# Patient Record
Sex: Female | Born: 1955 | Race: White | Hispanic: No | Marital: Single | Smoking: Never smoker
Health system: Southern US, Community
[De-identification: ages and names within clinical notes are randomized; demographics above are authoritative.]

## PROBLEM LIST (undated history)

## (undated) DIAGNOSIS — T7840XA Allergy, unspecified, initial encounter: Secondary | ICD-10-CM

## (undated) HISTORY — PX: COLONOSCOPY WITH PROPOFOL: SHX5780

## (undated) HISTORY — PX: UTERINE ARTERY EMBOLIZATION: SHX2629

## (undated) HISTORY — DX: Allergy, unspecified, initial encounter: T78.40XA

---

## 2014-06-12 ENCOUNTER — Other Ambulatory Visit (HOSPITAL_COMMUNITY)
Admission: RE | Admit: 2014-06-12 | Discharge: 2014-06-12 | Disposition: A | Discharge status: Home / Self Care | Payer: 59 | Source: Ambulatory Visit | Attending: Family Medicine | Admitting: Family Medicine

## 2014-06-12 DIAGNOSIS — Z124 Encounter for screening for malignant neoplasm of cervix: Secondary | ICD-10-CM | POA: Insufficient documentation

## 2014-06-12 DIAGNOSIS — Z1151 Encounter for screening for human papillomavirus (HPV): Secondary | ICD-10-CM | POA: Insufficient documentation

## 2014-06-18 ENCOUNTER — Other Ambulatory Visit: Payer: Self-pay

## 2014-06-18 ENCOUNTER — Ambulatory Visit
Admission: RE | Admit: 2014-06-18 | Discharge: 2014-06-18 | Disposition: A | Discharge status: Home / Self Care | Payer: 59 | Source: Ambulatory Visit

## 2014-06-18 DIAGNOSIS — Z1231 Encounter for screening mammogram for malignant neoplasm of breast: Secondary | ICD-10-CM

## 2015-06-18 DIAGNOSIS — E782 Mixed hyperlipidemia: Secondary | ICD-10-CM | POA: Diagnosis not present

## 2015-06-18 DIAGNOSIS — Z0000 Encounter for general adult medical examination without abnormal findings: Secondary | ICD-10-CM | POA: Diagnosis not present

## 2015-12-02 DIAGNOSIS — Z23 Encounter for immunization: Secondary | ICD-10-CM | POA: Diagnosis not present

## 2016-07-02 ENCOUNTER — Other Ambulatory Visit: Payer: Self-pay | Admitting: Family Medicine

## 2016-07-02 DIAGNOSIS — I10 Essential (primary) hypertension: Secondary | ICD-10-CM | POA: Diagnosis not present

## 2016-07-02 DIAGNOSIS — E782 Mixed hyperlipidemia: Secondary | ICD-10-CM | POA: Diagnosis not present

## 2016-07-02 DIAGNOSIS — Z0000 Encounter for general adult medical examination without abnormal findings: Secondary | ICD-10-CM | POA: Diagnosis not present

## 2016-07-02 DIAGNOSIS — D259 Leiomyoma of uterus, unspecified: Secondary | ICD-10-CM

## 2016-07-08 ENCOUNTER — Other Ambulatory Visit: Payer: Self-pay | Admitting: Family Medicine

## 2016-07-08 DIAGNOSIS — Z1231 Encounter for screening mammogram for malignant neoplasm of breast: Secondary | ICD-10-CM

## 2016-07-15 ENCOUNTER — Ambulatory Visit
Admission: RE | Admit: 2016-07-15 | Discharge: 2016-07-15 | Disposition: A | Discharge status: Home / Self Care | Payer: BLUE CROSS/BLUE SHIELD | Source: Ambulatory Visit | Attending: Family Medicine | Admitting: Family Medicine

## 2016-07-15 DIAGNOSIS — D259 Leiomyoma of uterus, unspecified: Secondary | ICD-10-CM

## 2016-07-15 DIAGNOSIS — D251 Intramural leiomyoma of uterus: Secondary | ICD-10-CM | POA: Diagnosis not present

## 2016-07-21 ENCOUNTER — Ambulatory Visit
Admission: RE | Admit: 2016-07-21 | Discharge: 2016-07-21 | Disposition: A | Discharge status: Home / Self Care | Payer: BLUE CROSS/BLUE SHIELD | Source: Ambulatory Visit | Attending: Family Medicine | Admitting: Family Medicine

## 2016-07-21 DIAGNOSIS — Z23 Encounter for immunization: Secondary | ICD-10-CM | POA: Diagnosis not present

## 2016-07-21 DIAGNOSIS — Z1231 Encounter for screening mammogram for malignant neoplasm of breast: Secondary | ICD-10-CM

## 2016-09-30 DIAGNOSIS — Z23 Encounter for immunization: Secondary | ICD-10-CM | POA: Diagnosis not present

## 2016-10-05 DIAGNOSIS — J0100 Acute maxillary sinusitis, unspecified: Secondary | ICD-10-CM | POA: Diagnosis not present

## 2017-10-03 DIAGNOSIS — C44319 Basal cell carcinoma of skin of other parts of face: Secondary | ICD-10-CM | POA: Diagnosis not present

## 2017-10-31 DIAGNOSIS — Z08 Encounter for follow-up examination after completed treatment for malignant neoplasm: Secondary | ICD-10-CM | POA: Diagnosis not present

## 2017-10-31 DIAGNOSIS — Z85828 Personal history of other malignant neoplasm of skin: Secondary | ICD-10-CM | POA: Diagnosis not present

## 2017-12-23 ENCOUNTER — Other Ambulatory Visit (HOSPITAL_COMMUNITY)
Admission: RE | Admit: 2017-12-23 | Discharge: 2017-12-23 | Disposition: A | Discharge status: Home / Self Care | Payer: BLUE CROSS/BLUE SHIELD | Source: Ambulatory Visit | Attending: Family Medicine | Admitting: Family Medicine

## 2017-12-23 ENCOUNTER — Other Ambulatory Visit: Payer: Self-pay | Admitting: Family Medicine

## 2017-12-23 DIAGNOSIS — I10 Essential (primary) hypertension: Secondary | ICD-10-CM | POA: Diagnosis not present

## 2017-12-23 DIAGNOSIS — Z01411 Encounter for gynecological examination (general) (routine) with abnormal findings: Secondary | ICD-10-CM | POA: Diagnosis not present

## 2017-12-23 DIAGNOSIS — E782 Mixed hyperlipidemia: Secondary | ICD-10-CM | POA: Diagnosis not present

## 2017-12-23 DIAGNOSIS — Z0000 Encounter for general adult medical examination without abnormal findings: Secondary | ICD-10-CM | POA: Diagnosis not present

## 2017-12-23 DIAGNOSIS — Z23 Encounter for immunization: Secondary | ICD-10-CM | POA: Diagnosis not present

## 2017-12-26 ENCOUNTER — Other Ambulatory Visit: Payer: Self-pay | Admitting: Family Medicine

## 2017-12-26 DIAGNOSIS — R102 Pelvic and perineal pain: Secondary | ICD-10-CM

## 2017-12-27 LAB — CYTOLOGY - PAP
Adequacy: ABSENT
DIAGNOSIS: NEGATIVE
HPV (WINDOPATH): NOT DETECTED

## 2018-01-09 ENCOUNTER — Other Ambulatory Visit: Payer: Self-pay | Admitting: Family Medicine

## 2018-01-09 DIAGNOSIS — Z1231 Encounter for screening mammogram for malignant neoplasm of breast: Secondary | ICD-10-CM

## 2018-01-12 ENCOUNTER — Ambulatory Visit
Admission: RE | Admit: 2018-01-12 | Discharge: 2018-01-12 | Disposition: A | Discharge status: Home / Self Care | Payer: BLUE CROSS/BLUE SHIELD | Source: Ambulatory Visit | Attending: Family Medicine | Admitting: Family Medicine

## 2018-01-12 DIAGNOSIS — D259 Leiomyoma of uterus, unspecified: Secondary | ICD-10-CM | POA: Diagnosis not present

## 2018-01-12 DIAGNOSIS — R102 Pelvic and perineal pain: Secondary | ICD-10-CM

## 2018-01-17 ENCOUNTER — Ambulatory Visit
Admission: RE | Admit: 2018-01-17 | Discharge: 2018-01-17 | Disposition: A | Discharge status: Home / Self Care | Payer: BLUE CROSS/BLUE SHIELD | Source: Ambulatory Visit | Attending: Family Medicine | Admitting: Family Medicine

## 2018-01-17 DIAGNOSIS — Z1231 Encounter for screening mammogram for malignant neoplasm of breast: Secondary | ICD-10-CM

## 2018-11-19 IMAGING — US US PELVIS COMPLETE TRANSABD/TRANSVAG
1 series · 14 of 25 positions shown · non-contrast
Comparison: 07/15/2016 ultrasound

CLINICAL DATA: 62-year-old postmenopausal female with pelvic pain.
Family history of ovarian cancer.

EXAM:
TRANSABDOMINAL AND TRANSVAGINAL ULTRASOUND OF PELVIS
TECHNIQUE: Both transabdominal and transvaginal ultrasound examinations of the
pelvis were performed. Transabdominal technique was performed for
global imaging of the pelvis including uterus, ovaries, adnexal
regions, and pelvic cul-de-sac. It was necessary to proceed with
endovaginal exam following the transabdominal exam to visualize the
ovaries and endometrium.

[Series 1: us pelvis complete transabd/transvag · 0.30mm/px · 14 of 33 slices shown]
[im 1/33]
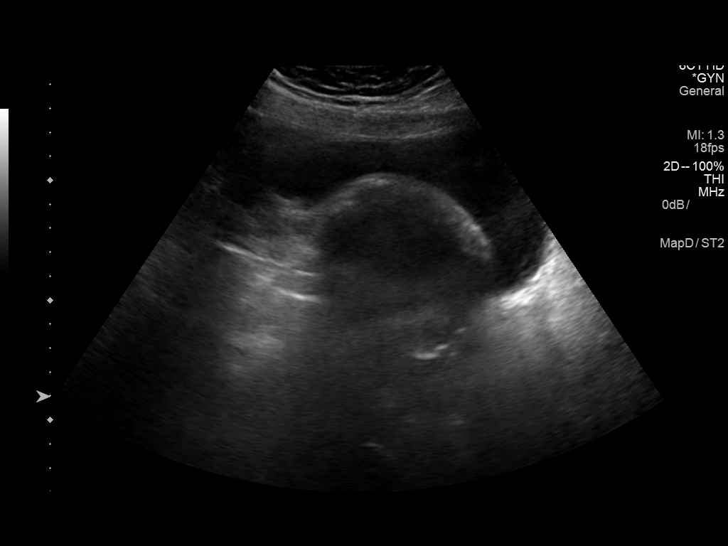
[im 3/33]
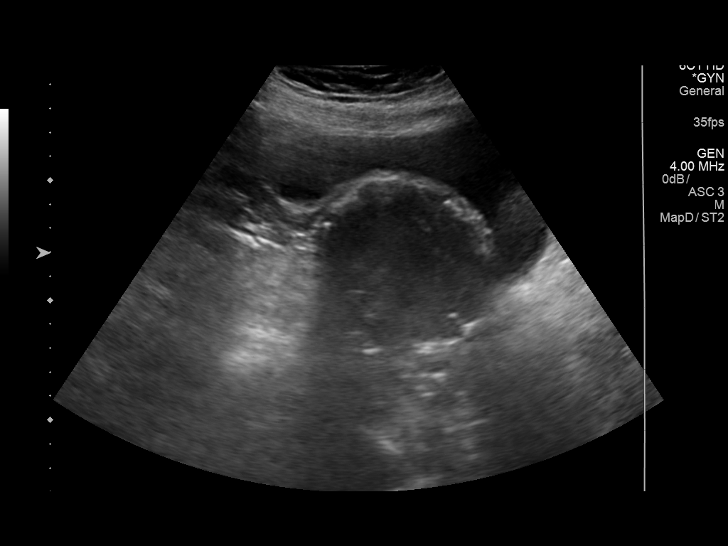
[im 6/33]
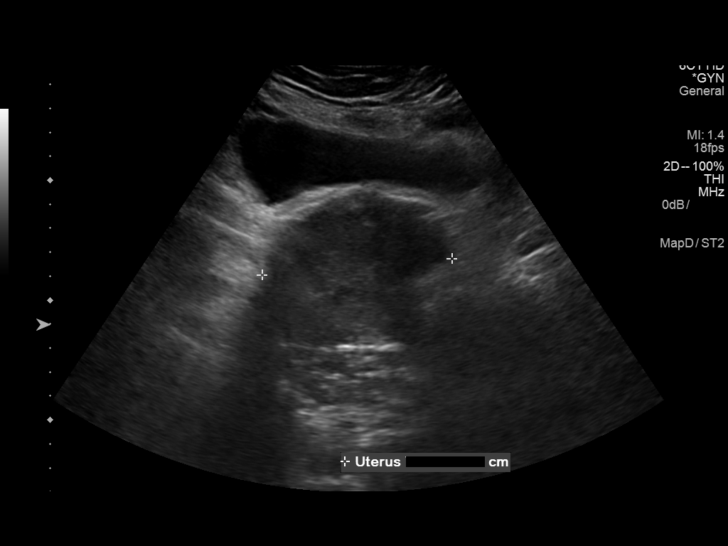
[im 9/33]
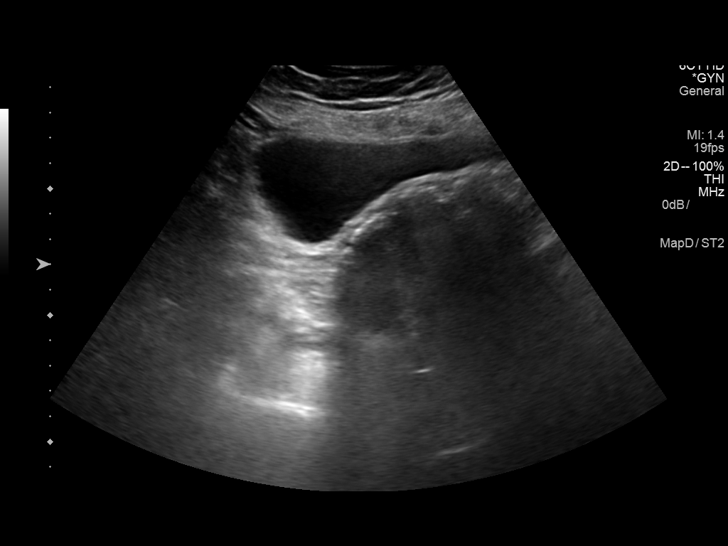
[im 11/33]
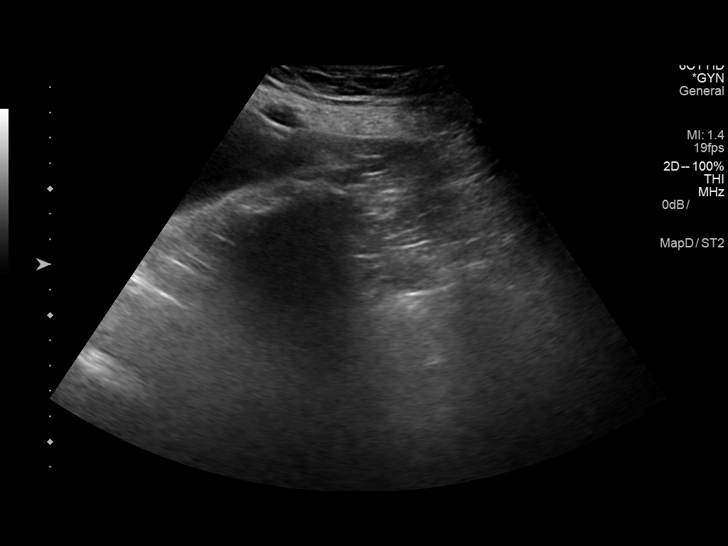
[im 13/33]
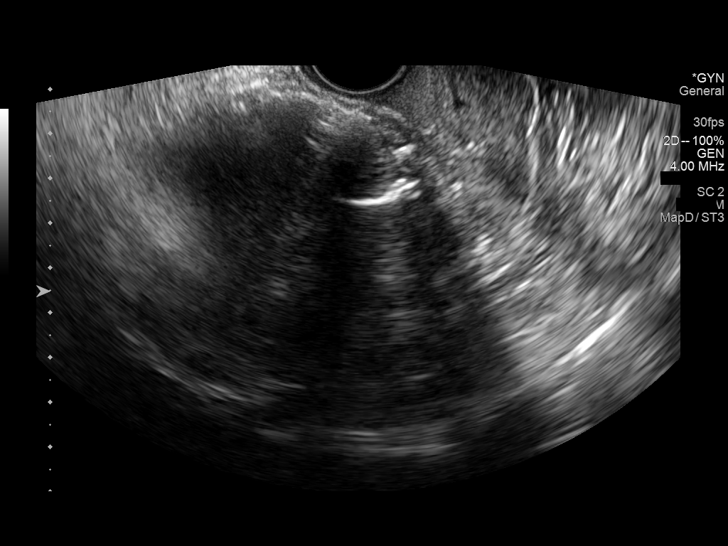
[im 15/33]
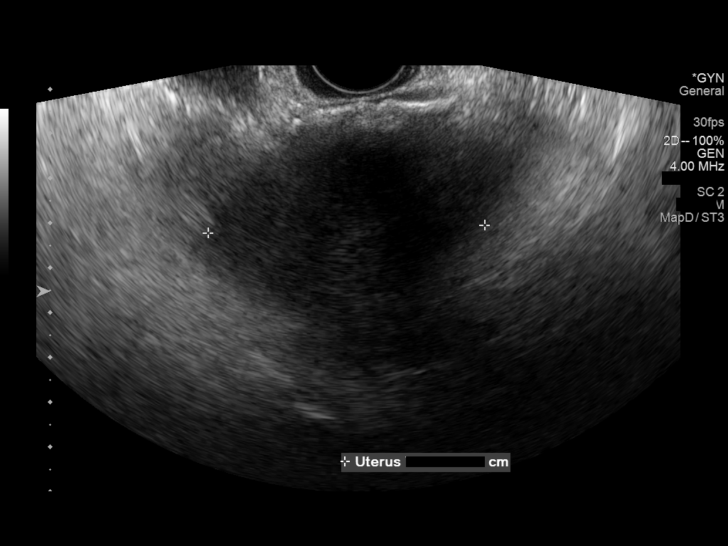
[im 18/33]
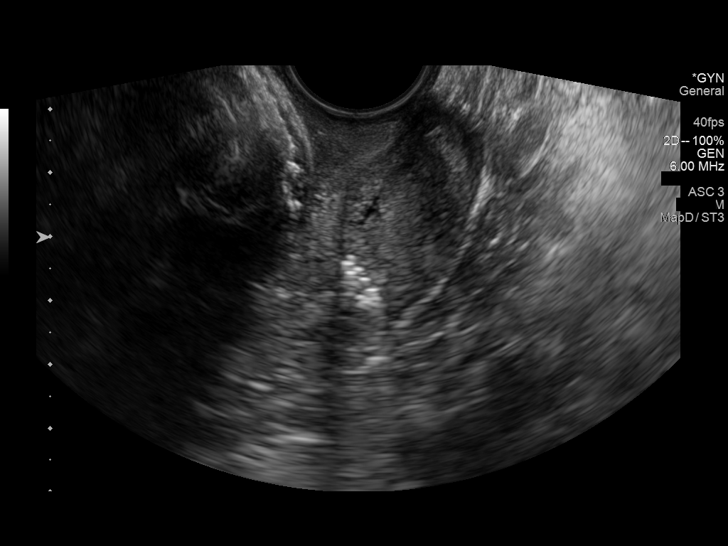
[im 21/33]
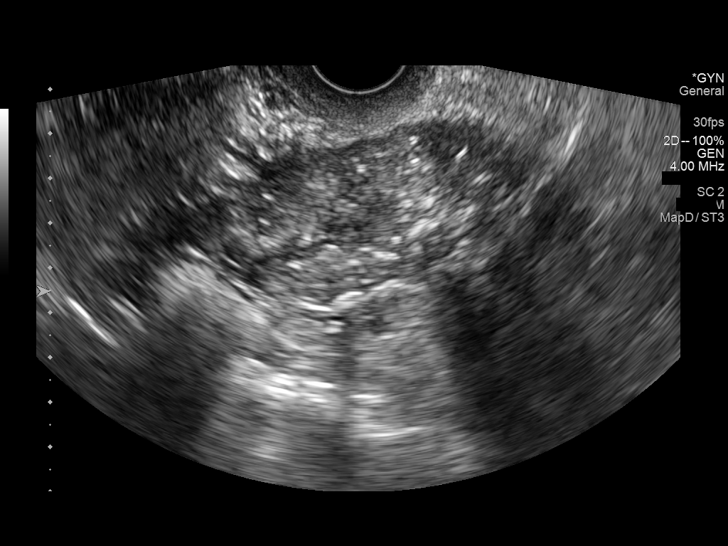
[im 22/33]
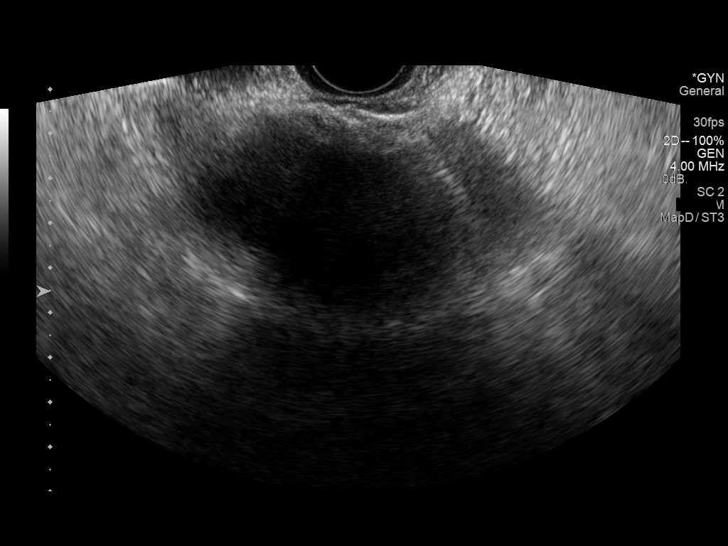
[im 25/33]
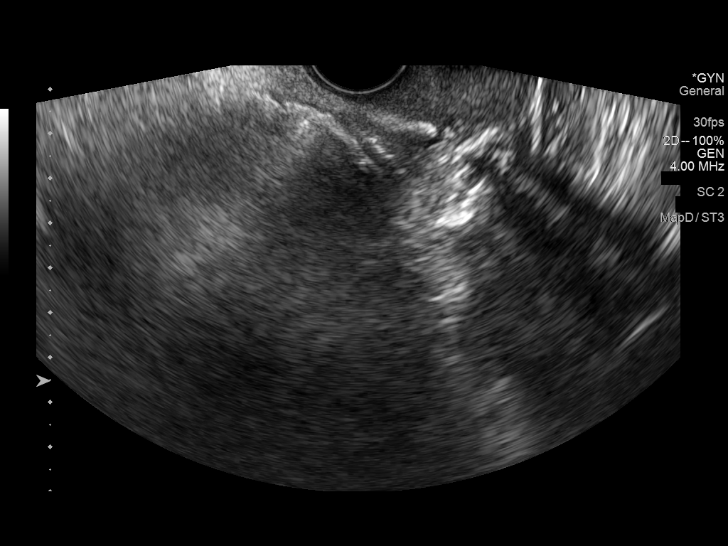
[im 27/33]
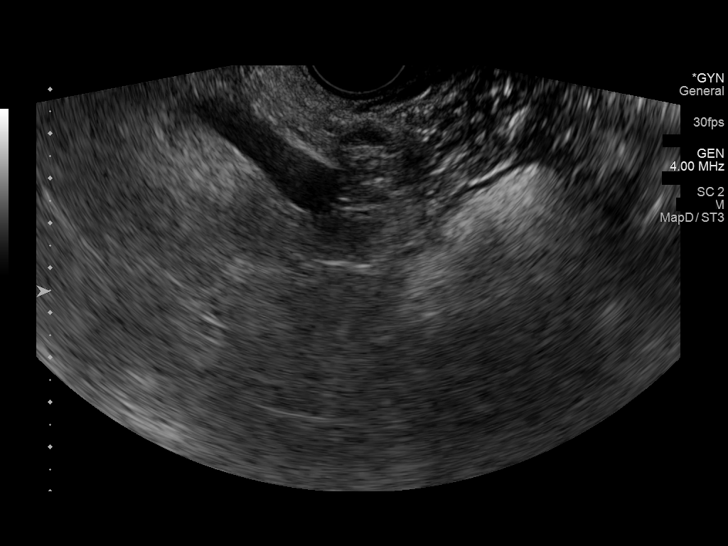
[im 30/33]
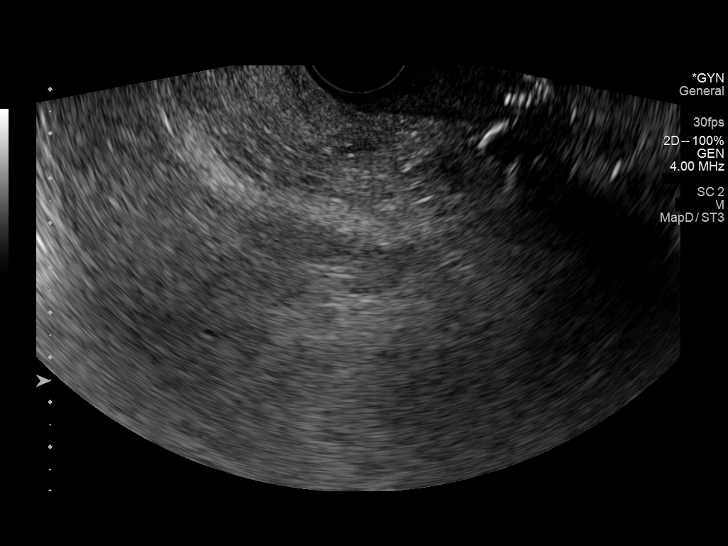
[im 33/33]
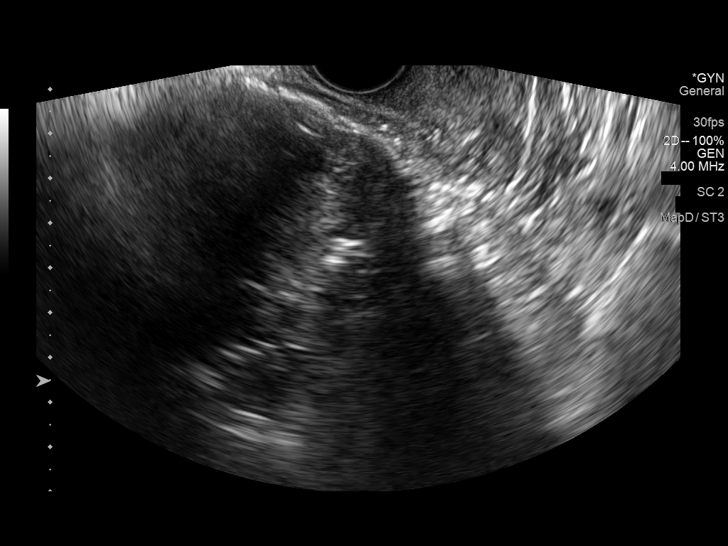

[14 of 25 positions shown; findings below may reference images not displayed]

FINDINGS: Uterus

Measurements: 9.2 x 6.6 x 6.2 cm.. Heterogeneous echotexture with
large calcified fibroid again noted measuring at least 6 cm.

Endometrium

Thickness: Not visualized due to fibroid and uterine heterogeneity.

Right ovary

Not visualized

Left ovary

Not visualized

Other findings

No abnormal free fluid or adnexal mass.
IMPRESSION: 1. Ovaries bilaterally not visualized.  No adnexal mass identified.
2. Large calcified fibroid again noted. The endometrial stripe and
remainder of the uterus is not well evaluated due to this fibroid.

## 2021-12-22 ENCOUNTER — Other Ambulatory Visit: Payer: Self-pay

## 2021-12-22 ENCOUNTER — Ambulatory Visit (INDEPENDENT_AMBULATORY_CARE_PROVIDER_SITE_OTHER): Payer: Medicare Other | Admitting: Family

## 2021-12-22 ENCOUNTER — Encounter: Payer: Self-pay | Admitting: Family

## 2021-12-22 VITALS — BP 132/89 | HR 72 | Temp 98.6°F | Resp 16 | Ht 67.5 in | Wt 216.4 lb

## 2021-12-22 DIAGNOSIS — Z23 Encounter for immunization: Secondary | ICD-10-CM

## 2021-12-22 DIAGNOSIS — E559 Vitamin D deficiency, unspecified: Secondary | ICD-10-CM | POA: Diagnosis not present

## 2021-12-22 DIAGNOSIS — Z1231 Encounter for screening mammogram for malignant neoplasm of breast: Secondary | ICD-10-CM

## 2021-12-22 DIAGNOSIS — D251 Intramural leiomyoma of uterus: Secondary | ICD-10-CM | POA: Insufficient documentation

## 2021-12-22 DIAGNOSIS — F418 Other specified anxiety disorders: Secondary | ICD-10-CM

## 2021-12-22 DIAGNOSIS — L659 Nonscarring hair loss, unspecified: Secondary | ICD-10-CM | POA: Diagnosis not present

## 2021-12-22 DIAGNOSIS — Z13220 Encounter for screening for lipoid disorders: Secondary | ICD-10-CM | POA: Diagnosis not present

## 2021-12-22 DIAGNOSIS — J301 Allergic rhinitis due to pollen: Secondary | ICD-10-CM | POA: Diagnosis not present

## 2021-12-22 DIAGNOSIS — Z0000 Encounter for general adult medical examination without abnormal findings: Secondary | ICD-10-CM

## 2021-12-22 DIAGNOSIS — Z5320 Procedure and treatment not carried out because of patient's decision for unspecified reasons: Secondary | ICD-10-CM

## 2021-12-22 DIAGNOSIS — Z1283 Encounter for screening for malignant neoplasm of skin: Secondary | ICD-10-CM

## 2021-12-22 DIAGNOSIS — D509 Iron deficiency anemia, unspecified: Secondary | ICD-10-CM

## 2021-12-22 DIAGNOSIS — Z780 Asymptomatic menopausal state: Secondary | ICD-10-CM

## 2021-12-22 DIAGNOSIS — Z1211 Encounter for screening for malignant neoplasm of colon: Secondary | ICD-10-CM

## 2021-12-22 DIAGNOSIS — D229 Melanocytic nevi, unspecified: Secondary | ICD-10-CM

## 2021-12-22 MED ORDER — ALPRAZOLAM 0.5 MG PO TABS: 0.5000 mg | ORAL_TABLET | Freq: Every evening | ORAL | 0 refills | Status: AC | PRN

## 2021-12-22 MED ORDER — ALPRAZOLAM 0.5 MG PO TABS: 0.5000 mg | ORAL_TABLET | Freq: Every evening | ORAL | 0 refills | Status: DC | PRN

## 2021-12-22 NOTE — Patient Instructions (Addendum)
------------------------------------   I have ordered transvaginal ultrasound at [place]. Please call to schedule.   Teller: [facility]- enter through medical mall entrance [address], [place], [place] [address]   ------------------------------------ I have sent an electronic order over to your preferred location for the following:   '[]'$   2D Mammogram  '[x]'$   3D Mammogram  '[x]'$   Bone Density   Please give this center a call to get scheduled at your convenience.  '[x]'$   [facility]  [place] [address]  [phone]  Make sure to wear two piece  clothing  No lotions powders or deodorants the day of the appointment Make sure to bring picture ID and insurance card.  Bring list of medications you are currently taking including any supplements.   ------------------------------------ Start monitoring your blood pressure daily, around the same time of day, for the next 2-3 weeks.  Ensure that you have rested for 30 minutes prior to checking your blood pressure. Record your readings and bring them to your next visit.   ------------------------------------ A referral was placed today for dermatology Please let us know if you have not heard back within 2 weeks about the referral.  ------------------------------------  A referral was placed today for colonoscopy.  Please let us know if you have not heard back within 2 weeks about the referral.  ------------------------------------  Welcome to our clinic, I am happy to have you as my new patient. I am excited to continue on this healthcare journey with you.  Stop by the lab prior to leaving today. I will notify you of your results once received.   Please keep in mind Any my chart messages you send have up to a three business day turnaround for a response.  Phone calls may take up to a one full business day turnaround for a   response.   If you need a medication refill I recommend you request it through the pharmacy as this is easiest for [place] rather than sending a message and or phone call.   Due to recent changes in healthcare laws, you may see results of your imaging and/or laboratory studies on MyChart before I have had a chance to review them.  I understand that in some cases there may be results that are confusing or concerning to you. Please understand that not all results are received at the same time and often I may need to interpret multiple results in order to provide you with the best plan of care or course of treatment. Therefore, I ask that you please give me 2 business days to thoroughly review all your results before contacting my office for clarification. Should we see a critical lab result, you will be contacted sooner.   It was a pleasure seeing you today! Please do not hesitate to reach out with any questions and or concerns.  Regards,   Eugenia Pancoast FNP-C

## 2021-12-22 NOTE — Assessment & Plan Note (Addendum)
Propanolol 10 mg for situational anxiety also refilled alprazolam 0.5 mg prn anxiety  PDMP reviewed prior  Along with annual physical discussed history/social/family history as well as a few acute and chronic concerns in office.

## 2021-12-22 NOTE — Assessment & Plan Note (Signed)
Cbc ordered pending results. 

## 2021-12-22 NOTE — Assessment & Plan Note (Signed)
Patient Counseling(The following topics were reviewed):  Preventative care handout given to pt  Health maintenance and immunizations reviewed. Please refer to Health maintenance section. Pt advised on safe sex, wearing seatbelts in car, and proper nutrition labwork ordered today for annual Dental health: Discussed importance of regular tooth brushing, flossing, and dental visits.  Ordered mammogram  Ordered bone density Referral placed for colonoscopy Pt declines hepatitis c and hiv screening Pneumonia 23 today in office, consider prevnar next year Pt to schedule pap next available

## 2021-12-22 NOTE — Progress Notes (Signed)
New Patient Office Visit  Subjective:  Patient ID: Amanda Owens, female    DOB: Apr 01, 1955  Age: 66 y.o. MRN: [id number]  CC:  Chief Complaint  Patient presents with  . Establish Care    HPI Amanda Owens is here to establish care as a new patient.  Prior provider was: [facility] , physician left practice , last there around 2019.  Pt is without acute concerns.   Last pap 12/23/2017, negative.  Mammogram: last 2019 as well  Colonoscopy: last one she states over 10 years ago.  Father with colon cancer history and dads mom with h/o colon cancer.   Pneumo 23, never had.   Thinning hair with hair loss, all over her body. She was seen by dermatology in the past, about ten years ago, tried propecia without much relief.   chronic concerns:  Pap: she states historically always got an ultrasound of her pelvis when she got a pap due to uterine fibroid. She is requesting this as well. She is concerned because both of her sister have had ovarian cancer, one passed away.   GAD: takes xanax 0.5 mg once daily prn, usually with social anxiety or if she has public speaking. Driving on the highway can also stress her out, so typically will just take the propanolol.   Tachycardia: usually with social anxiety, will typically take one each of 10 mg propanolol as well as one xanax as needed.    Elevated blood pressure: does have fmh hypertension. Her sister who she lives with at home does have blood pressure machine.   Past Medical History:  Diagnosis Date  . Allergy     Past Surgical History:  Procedure Laterality Date  . UTERINE ARTERY EMBOLIZATION      Family History  Problem Relation Age of Onset  . Stroke Mother   . Hypertension Mother   . Colon cancer Father        dx around age 96  . Ovarian cancer Sister   . Hypertension Sister   . Ovarian cancer Sister   . Hypertension Sister   . Colon polyps Sister        precancerous  . Hypertension Sister   . Diabetes  Sister   . Early death Maternal Grandmother   . Stroke Maternal Grandfather   . Early death Maternal Grandfather   . Colon cancer Paternal Grandmother   . Early death Paternal Grandfather   . Breast cancer Neg Hx     Social History   Socioeconomic History  . Marital status: Single    Spouse name: Not on file  . Number of children: 0  . Years of education: Not on file  . Highest education level: Not on file  Occupational History  . Occupation: retired  Tobacco Use  . Smoking status: Never  . Smokeless tobacco: Never  Vaping Use  . Vaping Use: Never used  Substance and Sexual Activity  . Alcohol use: Yes    Alcohol/week: 1.0 standard drink of alcohol    Types: 1 Shots of liquor per week    Comment: one shot bourbon with dinner  . Drug use: Never  . Sexual activity: Not Currently  Other Topics Concern  . Not on file  Social History Narrative  . Not on file   Social Determinants of Health   Financial Resource Strain: Not on file  Food Insecurity: Not on file  Transportation Needs: Not on file  Physical Activity: Not on file  Stress: Not on  file  Social Connections: Not on file  Intimate Partner Violence: Not on file    Outpatient Medications Prior to Visit  Medication Sig Dispense Refill  . cetirizine (ZYRTEC ALLERGY) 10 MG tablet     . propranolol (INDERAL) 10 MG tablet     . ALPRAZolam (XANAX) 0.5 MG tablet      No facility-administered medications prior to visit.    Allergies  Allergen Reactions  . Sulfa Antibiotics Hives and Swelling    ROS Review of Systems  Review of Systems  Respiratory:  Negative for shortness of breath.   Cardiovascular:  Negative for chest pain and palpitations.  Gastrointestinal:  Negative for constipation and diarrhea.  Genitourinary:  Negative for dysuria, frequency and urgency.  Musculoskeletal:  Negative for myalgias.  Psychiatric/Behavioral:  Negative for depression and suicidal ideas. Anxiety at times  Skin: thinning  hair line, chronic  All other systems reviewed and are negative.    Objective:    Physical Exam Vitals reviewed.  Constitutional:      General: She is not in acute distress.    Appearance: Normal appearance. She is obese. She is not ill-appearing, toxic-appearing or diaphoretic.  HENT:     Right Ear: Tympanic membrane normal.     Left Ear: Tympanic membrane normal.     Mouth/Throat:     Mouth: Mucous membranes are moist.     Pharynx: No pharyngeal swelling.     Tonsils: No tonsillar exudate.  Eyes:     Extraocular Movements: Extraocular movements intact.     Conjunctiva/sclera: Conjunctivae normal.     Pupils: Pupils are equal, round, and reactive to light.  Neck:     Thyroid: No thyroid mass.  Cardiovascular:     Rate and Rhythm: Normal rate and regular rhythm.  Pulmonary:     Effort: Pulmonary effort is normal.     Breath sounds: Normal breath sounds.  Abdominal:     General: Abdomen is flat. Bowel sounds are normal.     Palpations: Abdomen is soft.  Musculoskeletal:        General: Normal range of motion.  Lymphadenopathy:     Cervical:     Right cervical: No superficial cervical adenopathy.    Left cervical: No superficial cervical adenopathy.  Skin:    General: Skin is warm.     Capillary Refill: Capillary refill takes less than 2 seconds.     Comments: Receding of hairline anterior scalp   Neurological:     General: No focal deficit present.     Mental Status: She is alert and oriented to person, place, and time.  Psychiatric:        Mood and Affect: Mood normal.        Behavior: Behavior normal.        Thought Content: Thought content normal.        Judgment: Judgment normal.      BP 132/89   Pulse 72   Temp 98.6 F (37 C)   Resp 16   Ht 5' 7.5" (1.715 m)   Wt 216 lb 6 oz (98.1 kg)   SpO2 97%   BMI 33.39 kg/m  Wt Readings from Last 3 Encounters:  12/22/21 216 lb 6 oz (98.1 kg)     Health Maintenance Due  Topic Date Due  . COLONOSCOPY (Pts  45-78yr Insurance coverage will need to be confirmed)  Never done  . MAMMOGRAM  01/18/2020  . PAP SMEAR-Modifier  12/23/2020  . DEXA SCAN  Never done  There are no preventive care reminders to display for this patient.  No results found for: "TSH" No results found for: "WBC", "HGB", "HCT", "MCV", "PLT" No results found for: "NA", "K", "CHLORIDE", "CO2", "GLUCOSE", "BUN", "CREATININE", "BILITOT", "ALKPHOS", "AST", "ALT", "PROT", "ALBUMIN", "CALCIUM", "ANIONGAP", "EGFR", "GFR" No results found for: "CHOL" No results found for: "HDL" No results found for: "LDLCALC" No results found for: "TRIG" No results found for: "CHOLHDL" No results found for: "HGBA1C"    Assessment & Plan:   Problem List Items Addressed This Visit       Respiratory   Seasonal allergic rhinitis due to pollen    Recommend nightly otc zyrtec         Musculoskeletal and Integument   Alopecia    Ordering CBC and TSH Referral back to dermatology for this as well as annual skin screening and eval of moles      Relevant Orders   Ambulatory referral to Dermatology     Genitourinary   Intramural leiomyoma of uterus    Transvaginal ultrasound ordered       Relevant Orders   US PELVIC COMPLETE WITH TRANSVAGINAL     Other   Situational anxiety    Propanolol 10 mg for situational anxiety also refilled alprazolam 0.5 mg prn anxiety  PDMP reviewed prior  Along with annual physical discussed history/social/family history as well as a few acute and chronic concerns in office.       Relevant Medications   ALPRAZolam (XANAX) 0.5 MG tablet   Iron deficiency anemia    Cbc ordered pending results      Relevant Orders   CBC with Differential/Platelet   Vitamin D deficiency    Ordered vitamin d pending results.        Relevant Orders   Vitamin D, 25-hydroxy   Encounter for general adult medical examination without abnormal findings - Primary    Patient Counseling(The following topics were  reviewed):  Preventative care handout given to pt  Health maintenance and immunizations reviewed. Please refer to Health maintenance section. Pt advised on safe sex, wearing seatbelts in car, and proper nutrition labwork ordered today for annual Dental health: Discussed importance of regular tooth brushing, flossing, and dental visits.  Ordered mammogram  Ordered bone density Referral placed for colonoscopy Pt declines hepatitis c and hiv screening Pneumonia 23 today in office, consider prevnar next year Pt to schedule pap next available      Encounter for vaccination    Pneumococcal 23  vaccine administered in office Pt tolerated procedure well  Verbal consent obtained prior to administration  Handout given in regards to vaccination.        RESOLVED: Thinning hair   Relevant Orders   TSH   Other Visit Diagnoses     HIV screening declined       Screening for hepatitis C declined       Screening for colon cancer       Relevant Orders   Ambulatory referral to Gastroenterology   Postmenopausal       Relevant Orders   MM 3D SCREEN BREAST BILATERAL   DG Bone Density   Comprehensive metabolic panel   Encounter for screening mammogram for malignant neoplasm of breast       Relevant Orders   MM 3D SCREEN BREAST BILATERAL   Screening for lipoid disorders       Relevant Orders   Lipid panel   Screening exam for skin cancer       Relevant Orders  Ambulatory referral to Dermatology   Numerous moles       Relevant Orders   Ambulatory referral to Dermatology       Meds ordered this encounter  Medications  . ALPRAZolam (XANAX) 0.5 MG tablet    Sig: Take 1 tablet (0.5 mg total) by mouth at bedtime as needed for anxiety.    Dispense:  30 tablet    Refill:  0    Order Specific Question:   Supervising Provider    Answer:   Diona Browner, AMY E [[id number]]    Follow-up: Return for f/u PAP, pt can schedule with me after her vacation .    Eugenia Pancoast, FNP

## 2021-12-22 NOTE — Assessment & Plan Note (Signed)
Ordered vitamin d pending results.   

## 2021-12-22 NOTE — Assessment & Plan Note (Signed)
Ordering CBC and TSH Referral back to dermatology for this as well as annual skin screening and eval of moles

## 2021-12-22 NOTE — Assessment & Plan Note (Signed)
Recommend nightly otc zyrtec

## 2021-12-22 NOTE — Assessment & Plan Note (Signed)
Transvaginal ultrasound ordered

## 2021-12-22 NOTE — Addendum Note (Signed)
Addended by: Eugenia Pancoast on: 12/22/2021 04:02 PM   Modules accepted: Orders

## 2021-12-22 NOTE — Assessment & Plan Note (Signed)
Pneumococcal 23  vaccine administered in office Pt tolerated procedure well  Verbal consent obtained prior to administration  Handout given in regards to vaccination.

## 2021-12-23 ENCOUNTER — Other Ambulatory Visit: Payer: Self-pay

## 2021-12-23 ENCOUNTER — Telehealth: Payer: Self-pay

## 2021-12-23 DIAGNOSIS — Z1211 Encounter for screening for malignant neoplasm of colon: Secondary | ICD-10-CM

## 2021-12-23 DIAGNOSIS — Z800 Family history of malignant neoplasm of digestive organs: Secondary | ICD-10-CM

## 2021-12-23 LAB — LIPID PANEL
Cholesterol: 188 mg/dL (ref 0–200)
HDL: 67.6 mg/dL (ref >39.00)
LDL Cholesterol: 103 mg/dL — ABNORMAL HIGH (ref 0–99)
NonHDL: 120.72
Total CHOL/HDL Ratio: 3
Triglycerides: 90 mg/dL (ref 0.0–149.0)
VLDL: 18 mg/dL (ref 0.0–40.0)

## 2021-12-23 LAB — CBC WITH DIFFERENTIAL/PLATELET
Basophils Absolute: 0 10*3/uL (ref 0.0–0.1)
Basophils Relative: 0.6 % (ref 0.0–3.0)
Eosinophils Absolute: 0.1 10*3/uL (ref 0.0–0.7)
Eosinophils Relative: 2.2 % (ref 0.0–5.0)
HCT: 40.2 % (ref 36.0–46.0)
Hemoglobin: 13.7 g/dL (ref 12.0–15.0)
Lymphocytes Relative: 22.2 % (ref 12.0–46.0)
Lymphs Abs: 1.5 10*3/uL (ref 0.7–4.0)
MCHC: 34 g/dL (ref 30.0–36.0)
MCV: 85 fl (ref 78.0–100.0)
Monocytes Absolute: 0.4 10*3/uL (ref 0.1–1.0)
Monocytes Relative: 6 % (ref 3.0–12.0)
Neutro Abs: 4.6 10*3/uL (ref 1.4–7.7)
Neutrophils Relative %: 69 % (ref 43.0–77.0)
Platelets: 218 10*3/uL (ref 150.0–400.0)
RBC: 4.73 Mil/uL (ref 3.87–5.11)
RDW: 12.9 % (ref 11.5–15.5)
WBC: 6.7 10*3/uL (ref 4.0–10.5)

## 2021-12-23 LAB — TSH: TSH: 1.88 u[IU]/mL (ref 0.35–5.50)

## 2021-12-23 LAB — COMPREHENSIVE METABOLIC PANEL
ALT: 16 U/L (ref 0–35)
AST: 18 U/L (ref 0–37)
Albumin: 4.2 g/dL (ref 3.5–5.2)
Alkaline Phosphatase: 75 U/L (ref 39–117)
BUN: 12 mg/dL (ref 6–23)
CO2: 29 mEq/L (ref 19–32)
Calcium: 9.2 mg/dL (ref 8.4–10.5)
Chloride: 103 mEq/L (ref 96–112)
Creatinine, Ser: 0.7 mg/dL (ref 0.40–1.20)
GFR: 90.95 mL/min (ref >60.00)
Glucose, Bld: 90 mg/dL (ref 70–99)
Potassium: 4.1 mEq/L (ref 3.5–5.1)
Sodium: 139 mEq/L (ref 135–145)
Total Bilirubin: 0.5 mg/dL (ref 0.2–1.2)
Total Protein: 6.6 g/dL (ref 6.0–8.3)

## 2021-12-23 LAB — VITAMIN D 25 HYDROXY (VIT D DEFICIENCY, FRACTURES): VITD: 35.97 ng/mL (ref 30.00–100.00)

## 2021-12-23 MED ORDER — NA SULFATE-K SULFATE-MG SULF 17.5-3.13-1.6 GM/177ML PO SOLN: 1.0000 | Freq: Once | ORAL | 0 refills | Status: AC

## 2021-12-23 NOTE — Telephone Encounter (Signed)
Gastroenterology Pre-Procedure Review  Request Date: 01/28/22 Requesting Physician: Dr. Vicente Males  PATIENT REVIEW QUESTIONS: The patient responded to the following health history questions as indicated:    1. Are you having any GI issues? no 2. Do you have a personal history of Polyps? no 3. Do you have a family history of Colon Cancer or Polyps? yes (father and paternal gm colon cancer, sister cancerous polyps) 4. Diabetes Mellitus? no 5. Joint replacements in the past 12 months?no 6. Major health problems in the past 3 months?no 7. Any artificial heart valves, MVP, or defibrillator?no    MEDICATIONS & ALLERGIES:    Patient reports the following regarding taking any anticoagulation/antiplatelet therapy:   Plavix, Coumadin, Eliquis, Xarelto, Lovenox, Pradaxa, Brilinta, or Effient? no Aspirin? no  Patient confirms/reports the following medications:  Current Outpatient Medications  Medication Sig Dispense Refill   ALPRAZolam (XANAX) 0.5 MG tablet Take 1 tablet (0.5 mg total) by mouth at bedtime as needed for anxiety. 30 tablet 0   cetirizine (ZYRTEC ALLERGY) 10 MG tablet      propranolol (INDERAL) 10 MG tablet      No current facility-administered medications for this visit.    Patient confirms/reports the following allergies:  Allergies  Allergen Reactions   Sulfa Antibiotics Hives and Swelling    No orders of the defined types were placed in this encounter.   AUTHORIZATION INFORMATION Primary Insurance: 1D#: Group #:  Secondary Insurance: 1D#: Group #:  SCHEDULE INFORMATION: Date: 01/28/22 Time: Location: [facility]

## 2022-01-25 ENCOUNTER — Ambulatory Visit: Payer: BLUE CROSS/BLUE SHIELD | Admitting: Family

## 2022-01-25 ENCOUNTER — Ambulatory Visit
Admission: RE | Admit: 2022-01-25 | Discharge: 2022-01-25 | Disposition: A | Discharge status: Home / Self Care | Payer: Medicare Other | Source: Ambulatory Visit | Attending: Family | Admitting: Family

## 2022-01-25 DIAGNOSIS — Z1231 Encounter for screening mammogram for malignant neoplasm of breast: Secondary | ICD-10-CM | POA: Insufficient documentation

## 2022-01-25 DIAGNOSIS — Z780 Asymptomatic menopausal state: Secondary | ICD-10-CM | POA: Diagnosis present

## 2022-01-26 ENCOUNTER — Ambulatory Visit
Admission: RE | Admit: 2022-01-26 | Discharge: 2022-01-26 | Disposition: A | Discharge status: Home / Self Care | Payer: Medicare Other | Source: Ambulatory Visit | Attending: Family | Admitting: Family

## 2022-01-26 DIAGNOSIS — D251 Intramural leiomyoma of uterus: Secondary | ICD-10-CM | POA: Insufficient documentation

## 2022-01-26 NOTE — Progress Notes (Signed)
Dr. Thornton Papas,   Would you be able to compare the size of the uterine fibroid to U/S transvaginally completed in record 01/12/2018? 2019 just states was measuring 'at least 6 cm' I am looking for stability if able. Thank you in advance.  I do see it was 6.8 cm in 2018.

## 2022-01-28 ENCOUNTER — Ambulatory Visit: Payer: Medicare Other | Admitting: Certified Registered"

## 2022-01-28 ENCOUNTER — Encounter
Admission: RE | Disposition: A | Discharge status: Home / Self Care | Payer: Self-pay | Source: Home / Self Care | Attending: Gastroenterology

## 2022-01-28 ENCOUNTER — Encounter: Payer: Self-pay | Admitting: Gastroenterology

## 2022-01-28 ENCOUNTER — Ambulatory Visit
Admission: RE | Admit: 2022-01-28 | Discharge: 2022-01-28 | Disposition: A | Discharge status: Home / Self Care | Payer: Medicare Other | Attending: Gastroenterology | Admitting: Gastroenterology

## 2022-01-28 DIAGNOSIS — D122 Benign neoplasm of ascending colon: Secondary | ICD-10-CM | POA: Insufficient documentation

## 2022-01-28 DIAGNOSIS — Z1211 Encounter for screening for malignant neoplasm of colon: Secondary | ICD-10-CM | POA: Insufficient documentation

## 2022-01-28 DIAGNOSIS — K5730 Diverticulosis of large intestine without perforation or abscess without bleeding: Secondary | ICD-10-CM | POA: Diagnosis not present

## 2022-01-28 DIAGNOSIS — Z83719 Family history of colon polyps, unspecified: Secondary | ICD-10-CM | POA: Diagnosis not present

## 2022-01-28 DIAGNOSIS — D126 Benign neoplasm of colon, unspecified: Secondary | ICD-10-CM

## 2022-01-28 DIAGNOSIS — Z800 Family history of malignant neoplasm of digestive organs: Secondary | ICD-10-CM | POA: Diagnosis not present

## 2022-01-28 HISTORY — PX: COLONOSCOPY WITH PROPOFOL: SHX5780

## 2022-01-28 SURGERY — COLONOSCOPY WITH PROPOFOL
Anesthesia: General

## 2022-01-28 MED ORDER — PROPOFOL 500 MG/50ML IV EMUL
INTRAVENOUS | Status: DC | PRN
  Administered 2022-01-28: 140 ug/kg/min via INTRAVENOUS

## 2022-01-28 MED ORDER — LIDOCAINE HCL (CARDIAC) PF 100 MG/5ML IV SOSY
PREFILLED_SYRINGE | INTRAVENOUS | Status: DC | PRN
  Administered 2022-01-28: 50 mg via INTRAVENOUS

## 2022-01-28 MED ORDER — SODIUM CHLORIDE 0.9 % IV SOLN: INTRAVENOUS | Status: DC

## 2022-01-28 MED ORDER — SIMETHICONE 40 MG/0.6ML PO SUSP
ORAL | Status: DC | PRN
  Administered 2022-01-28: 180 mL
  Administered 2022-01-28: 60 mL

## 2022-01-28 MED ORDER — PROPOFOL 10 MG/ML IV BOLUS
INTRAVENOUS | Status: DC | PRN
  Administered 2022-01-28: 100 mg via INTRAVENOUS

## 2022-01-28 NOTE — Op Note (Signed)
[facility] Gastroenterology Patient Name: Amanda Owens Procedure Date: 01/28/2022 10:40 AM MRN: [id number] Account #: [id number] Date of Birth: 1956/03/04 Admit Type: Outpatient Age: 66 Room: [facility] ENDO ROOM 3 Gender: Female Note Status: Finalized Instrument Name: Jasper Riling [id number] Procedure:             Colonoscopy Indications:           Screening in patient at increased risk: Family history                         of 1st-degree relative with colorectal cancer Providers:             Jonathon Bellows MD, MD Referring MD:          Eugenia Pancoast (Referring MD) Medicines:             Monitored Anesthesia Care Complications:         No immediate complications. Procedure:             Pre-Anesthesia Assessment:                        - Prior to the procedure, a History and Physical was                         performed, and patient medications, allergies and                         sensitivities were reviewed. The patient's tolerance                         of previous anesthesia was reviewed.                        - The risks and benefits of the procedure and the                         sedation options and risks were discussed with the                         patient. All questions were answered and informed                         consent was obtained.                        - After reviewing the risks and benefits, the patient                         was deemed in satisfactory condition to undergo the                         procedure.                        - ASA Grade Assessment: II - A patient with mild                         systemic disease.                        After obtaining informed consent,  the colonoscope was                         passed under direct vision. Throughout the procedure,                         the patient's blood pressure, pulse, and oxygen                         saturations were monitored continuously. The                          Colonoscope was introduced through the anus and                         advanced to the the cecum, identified by the                         appendiceal orifice. The colonoscopy was performed                         with ease. The patient tolerated the procedure well.                         The quality of the bowel preparation was excellent.                         The appendiceal orifice was photographed. Findings:      The perianal and digital rectal examinations were normal.      A 5 mm polyp was found in the ascending colon. The polyp was sessile.       The polyp was removed with a cold snare. Resection was complete, but the       polyp tissue was not retrieved.      The exam was otherwise without abnormality on direct and retroflexion       views.      Multiple small-mouthed diverticula were found in the sigmoid colon.      The exam was otherwise without abnormality on direct and retroflexion       views. Impression:            - One 5 mm polyp in the ascending colon, removed with                         a cold snare. Complete resection. Polyp tissue not                         retrieved.                        - The examination was otherwise normal on direct and                         retroflexion views.                        - Diverticulosis in the sigmoid colon.                        - The examination was otherwise normal on direct  and                         retroflexion views. Recommendation:        - Discharge patient to home (with escort).                        - Resume previous diet.                        - Continue present medications.                        - Repeat colonoscopy in 5 years for surveillance. Procedure Code(s):     --- Professional ---                        [phone], Colonoscopy, flexible; with removal of                         tumor(s), polyp(s), or other lesion(s) by snare                         technique Diagnosis Code(s):     --- Professional  ---                        Z80.0, Family history of malignant neoplasm of                         digestive organs                        D12.2, Benign neoplasm of ascending colon                        K57.30, Diverticulosis of large intestine without                         perforation or abscess without bleeding CPT copyright 2022 American Medical Association. All rights reserved. The codes documented in this report are preliminary and upon coder review may  be revised to meet current compliance requirements. Jonathon Bellows, MD Jonathon Bellows MD, MD 01/28/2022 11:04:46 AM This report has been signed electronically. Number of Addenda: 0 Note Initiated On: 01/28/2022 10:40 AM Scope Withdrawal Time: 0 hours 11 minutes 11 seconds  Total Procedure Duration: 0 hours 14 minutes 41 seconds  Estimated Blood Loss:  Estimated blood loss: none.      [facility]

## 2022-01-28 NOTE — Anesthesia Postprocedure Evaluation (Signed)
Anesthesia Post Note  Patient: Amanda Owens  Procedure(s) Performed: COLONOSCOPY WITH PROPOFOL  Patient location during evaluation: Endoscopy Anesthesia Type: General Level of consciousness: awake and alert Pain management: pain level controlled Vital Signs Assessment: post-procedure vital signs reviewed and stable Respiratory status: spontaneous breathing, nonlabored ventilation, respiratory function stable and patient connected to nasal cannula oxygen Cardiovascular status: blood pressure returned to baseline and stable Postop Assessment: no apparent nausea or vomiting Anesthetic complications: no  No notable events documented.   Last Vitals:  Vitals:   01/28/22 1118 01/28/22 1125  BP: (!) 145/81 (!) 153/84  Pulse: 64 64  Resp: 16 12  Temp:    SpO2: 100% 100%    Last Pain:  Vitals:   01/28/22 1125  TempSrc:   PainSc: 0-No pain                 Ilene Qua

## 2022-01-28 NOTE — H&P (Signed)
Jonathon Bellows, MD [address], [place], [place], [place], [id number], [place], [place], [place], [address] Phone: [phone]  Fax: [phone]  Primary Care Physician:  Eugenia Pancoast, FNP   Pre-Procedure History & Physical: HPI:  Amanda Owens is a 66 y.o. female is here for an colonoscopy.   Past Medical History:  Diagnosis Date   Allergy     Past Surgical History:  Procedure Laterality Date   COLONOSCOPY WITH PROPOFOL     UTERINE ARTERY EMBOLIZATION      Prior to Admission medications   Medication Sig Start Date End Date Taking? Authorizing Provider  cetirizine (ZYRTEC ALLERGY) 10 MG tablet     [provider]  propranolol (INDERAL) 10 MG tablet     [provider]    Allergies as of 12/23/2021 - Review Complete 12/23/2021  Allergen Reaction Noted   Sulfa antibiotics Hives and Swelling 12/22/2021    Family History  Problem Relation Age of Onset   Stroke Mother    Hypertension Mother    Alcohol abuse Father    Colon cancer Father        dx around age 86   Alcohol abuse Sister    Ovarian cancer Sister    Hypertension Sister    Alcohol abuse Sister    Ovarian cancer Sister    Hypertension Sister    Colon polyps Sister        precancerous   Hypertension Sister    Diabetes Sister    Hypertension Brother    Heart disease Brother    Early death Brother    Diabetes Brother    ADD / ADHD Brother    Early death Maternal Grandmother    Stroke Maternal Grandfather    Early death Maternal Grandfather    Colon cancer Paternal Grandmother    Early death Paternal Grandfather    Breast cancer Neg Hx     Social History   Socioeconomic History   Marital status: Single    Spouse name: Not on file   Number of children: 0   Years of education: Not on file   Highest education level: Not on file  Occupational History   Occupation: retired  Tobacco Use   Smoking status: Never   Smokeless tobacco: Never  Vaping Use    Vaping Use: Never used  Substance and Sexual Activity   Alcohol use: Yes    Alcohol/week: 1.0 standard drink of alcohol    Types: 1 Shots of liquor per week    Comment: one shot bourbon with dinner   Drug use: Never   Sexual activity: Not Currently  Other Topics Concern   Not on file  Social History Narrative   Not on file   Social Determinants of Health   Financial Resource Strain: Not on file  Food Insecurity: Not on file  Transportation Needs: Not on file  Physical Activity: Not on file  Stress: Not on file  Social Connections: Not on file  Intimate Partner Violence: Not on file    Review of Systems: See HPI, otherwise negative ROS  Physical Exam: BP (!) 171/81   Pulse 67   Temp (!) 96.7 F (35.9 C) (Temporal)   Resp 18   Ht '5\' 8"'$  (1.727 m)   Wt 99.8 kg   SpO2 97%   BMI 33.45 kg/m  General:   Alert,  pleasant and cooperative in NAD Head:  Normocephalic and atraumatic. Neck:  Supple; no masses or thyromegaly. Lungs:  Clear throughout to auscultation, normal respiratory effort.    Heart:  +S1, +S2, Regular rate and rhythm, No edema. Abdomen:  Soft, nontender and nondistended. Normal bowel sounds, without guarding, and without rebound.   Neurologic:  Alert and  oriented x4;  grossly normal neurologically.  Impression/Plan: Amanda Owens is here for an colonoscopy to be performed for Screening colonoscopy , father had colon cancer.   Risks, benefits, limitations, and alternatives regarding  colonoscopy have been reviewed with the patient.  Questions have been answered.  All parties agreeable.   Jonathon Bellows, MD  01/28/2022, 10:40 AM

## 2022-01-28 NOTE — Transfer of Care (Signed)
Immediate Anesthesia Transfer of Care Note  Patient: Amanda Owens  Procedure(s) Performed: COLONOSCOPY WITH PROPOFOL  Patient Location: Endoscopy Unit  Anesthesia Type:General  Level of Consciousness: drowsy  Airway & Oxygen Therapy: Patient Spontanous Breathing  Post-op Assessment: Report given to RN  Post vital signs: stable  Last Vitals:  Vitals Value Taken Time  BP    Temp    Pulse    Resp    SpO2      Last Pain:  Vitals:   01/28/22 1013  TempSrc: Temporal  PainSc: 0-No pain         Complications: No notable events documented.

## 2022-01-28 NOTE — Anesthesia Preprocedure Evaluation (Signed)
Anesthesia Evaluation  Patient identified by MRN, date of birth, ID band Patient awake    Reviewed: Allergy & Precautions, NPO status , Patient's Chart, lab work & pertinent test results  Airway Mallampati: II  TM Distance: >3 FB Neck ROM: full    Dental  (+) Chipped   Pulmonary neg pulmonary ROS   Pulmonary exam normal        Cardiovascular Exercise Tolerance: Good negative cardio ROS Normal cardiovascular exam     Neuro/Psych  PSYCHIATRIC DISORDERS Anxiety     negative neurological ROS     GI/Hepatic negative GI ROS, Neg liver ROS,,,  Endo/Other  negative endocrine ROS    Renal/GU negative Renal ROS  negative genitourinary   Musculoskeletal   Abdominal   Peds  Hematology negative hematology ROS (+)   Anesthesia Other Findings Past Medical History: No date: Allergy  Past Surgical History: No date: COLONOSCOPY WITH PROPOFOL No date: UTERINE ARTERY EMBOLIZATION  BMI    Body Mass Index: 33.45 kg/m      Reproductive/Obstetrics negative OB ROS                             Anesthesia Physical Anesthesia Plan  ASA: 2  Anesthesia Plan: General   Post-op Pain Management: Minimal or no pain anticipated   Induction: Intravenous  PONV Risk Score and Plan: Propofol infusion and TIVA  Airway Management Planned: Natural Airway and Nasal Cannula  Additional Equipment:   Intra-op Plan:   Post-operative Plan:   Informed Consent: I have reviewed the patients History and Physical, chart, labs and discussed the procedure including the risks, benefits and alternatives for the proposed anesthesia with the patient or authorized representative who has indicated his/her understanding and acceptance.     Dental Advisory Given  Plan Discussed with: Anesthesiologist, CRNA and Surgeon  Anesthesia Plan Comments: (Patient consented for risks of anesthesia including but not limited to:  -  adverse reactions to medications - risk of airway placement if required - damage to eyes, teeth, lips or other oral mucosa - nerve damage due to positioning  - sore throat or hoarseness - Damage to heart, brain, nerves, lungs, other parts of body or loss of life  Patient voiced understanding.)       Anesthesia Quick Evaluation

## 2022-01-29 ENCOUNTER — Other Ambulatory Visit (HOSPITAL_COMMUNITY)
Admission: RE | Admit: 2022-01-29 | Discharge: 2022-01-29 | Disposition: A | Discharge status: Home / Self Care | Payer: Medicare Other | Source: Ambulatory Visit | Attending: Family | Admitting: Family

## 2022-01-29 ENCOUNTER — Encounter: Payer: Self-pay | Admitting: Gastroenterology

## 2022-01-29 ENCOUNTER — Ambulatory Visit (INDEPENDENT_AMBULATORY_CARE_PROVIDER_SITE_OTHER): Payer: Medicare Other | Admitting: Family

## 2022-01-29 VITALS — BP 134/78 | HR 69 | Temp 98.2°F | Resp 16 | Ht 68.0 in | Wt 214.2 lb

## 2022-01-29 DIAGNOSIS — Z124 Encounter for screening for malignant neoplasm of cervix: Secondary | ICD-10-CM | POA: Diagnosis not present

## 2022-01-29 DIAGNOSIS — Z01419 Encounter for gynecological examination (general) (routine) without abnormal findings: Secondary | ICD-10-CM | POA: Insufficient documentation

## 2022-01-29 DIAGNOSIS — Z1151 Encounter for screening for human papillomavirus (HPV): Secondary | ICD-10-CM | POA: Insufficient documentation

## 2022-01-29 DIAGNOSIS — D259 Leiomyoma of uterus, unspecified: Secondary | ICD-10-CM

## 2022-01-29 DIAGNOSIS — F418 Other specified anxiety disorders: Secondary | ICD-10-CM

## 2022-01-29 DIAGNOSIS — Z860100 Personal history of colon polyps, unspecified: Secondary | ICD-10-CM | POA: Insufficient documentation

## 2022-01-29 MED ORDER — PROPRANOLOL HCL 10 MG PO TABS: 10.0000 mg | ORAL_TABLET | Freq: Every day | ORAL | 1 refills | Status: DC | PRN

## 2022-01-29 NOTE — Assessment & Plan Note (Signed)
Refill propanolol 10 mg for as needed for anxiety.

## 2022-01-29 NOTE — Assessment & Plan Note (Signed)
Growth since last pelvic u/s in 2019 Referral placed for gyn for eval/treat

## 2022-01-29 NOTE — Progress Notes (Signed)
Established Patient Office Visit  Subjective:  Patient ID: Amanda Owens, female    DOB: [birth year]  Age: 66 y.o. MRN: [id number]  CC:  Chief Complaint  Patient presents with   Gynecologic Exam    HPI Amanda Owens is here today for an well woman exam.   Pt is without acute concerns.   Mammogram: 01/25/22.  Last pap: 12/23/2017. Negative.  Colonoscopy:01/28/22 Bone density scan:scheduled 02/25/22.   U/s transvaginal pelvis, sometimes with left sided sciatica but has had for years. Has not had any recent changes that are outside of the normal, and she is two sisters with ovarian cancer history as well so pt worried for herself as well. No AUB. Ovaries were not visualized on u/s.   Anxiety, controlled except for in situational circumstances especially when speakingin front of a crowd. Uses propanolol prn. Requesting refill.  Past Medical History:  Diagnosis Date   Allergy     Past Surgical History:  Procedure Laterality Date   COLONOSCOPY WITH PROPOFOL     COLONOSCOPY WITH PROPOFOL N/A 01/28/2022   Procedure: COLONOSCOPY WITH PROPOFOL;  Surgeon: Jonathon Bellows, MD;  Location: [facility] ENDOSCOPY;  Service: Gastroenterology;  Laterality: N/A;   UTERINE ARTERY EMBOLIZATION      Family History  Problem Relation Age of Onset   Stroke Mother    Hypertension Mother    Alcohol abuse Father    Colon cancer Father        dx around age 26   Alcohol abuse Sister    Ovarian cancer Sister    Hypertension Sister    Alcohol abuse Sister    Ovarian cancer Sister    Hypertension Sister    Colon polyps Sister        precancerous   Hypertension Sister    Diabetes Sister    Hypertension Brother    Heart disease Brother    Early death Brother    Diabetes Brother    ADD / ADHD Brother    Early death Maternal Grandmother    Stroke Maternal Grandfather    Early death Maternal Grandfather    Colon cancer Paternal Grandmother    Early death Paternal Grandfather    Breast cancer Neg Hx      Social History   Socioeconomic History   Marital status: Single    Spouse name: Not on file   Number of children: 0   Years of education: Not on file   Highest education level: Not on file  Occupational History   Occupation: retired  Tobacco Use   Smoking status: Never   Smokeless tobacco: Never  Vaping Use   Vaping Use: Never used  Substance and Sexual Activity   Alcohol use: Yes    Alcohol/week: 1.0 standard drink of alcohol    Types: 1 Shots of liquor per week    Comment: one shot bourbon with dinner   Drug use: Never   Sexual activity: Not Currently  Other Topics Concern   Not on file  Social History Narrative   Not on file   Social Determinants of Health   Financial Resource Strain: Not on file  Food Insecurity: Not on file  Transportation Needs: Not on file  Physical Activity: Not on file  Stress: Not on file  Social Connections: Not on file  Intimate Partner Violence: Not on file    Outpatient Medications Prior to Visit  Medication Sig Dispense Refill   fexofenadine-pseudoephedrine (ALLEGRA-D 24) 180-240 MG 24 hr tablet Take 1 tablet by mouth daily.  propranolol (INDERAL) 10 MG tablet      cetirizine (ZYRTEC ALLERGY) 10 MG tablet  (Patient not taking: Reported on 01/29/2022)     No facility-administered medications prior to visit.    Allergies  Allergen Reactions   Sulfa Antibiotics Hives and Swelling    ROS Review of Systems  Review of Systems   Breasts: negative for nipple discharge, breast pain, breast mass, nipple changes, breast rash. Genitourinary:  Negative for decreased urine volume, difficulty urinating, dyspareunia, dysuria, flank pain, menstrual problem, pelvic pain, urgency, vaginal bleeding, vaginal discharge and vaginal pain.  Psychiatric/Behavioral:  Negative for depression and suicidal ideas.   All other systems reviewed and are negative.    Objective:    Physical Exam Constitutional:      General: She is not in acute  distress.    Appearance: Normal appearance. She is normal weight. She is not ill-appearing, toxic-appearing or diaphoretic.  Cardiovascular:     Rate and Rhythm: Normal rate and regular rhythm.  Pulmonary:     Effort: Pulmonary effort is normal.     Breath sounds: Normal breath sounds.  Chest:  Breasts:    Tanner Score is 5.     Breasts are symmetrical.     Right: Normal. No swelling, bleeding, inverted nipple, mass, nipple discharge, skin change or tenderness.     Left: Normal. No swelling, bleeding, inverted nipple, mass, nipple discharge, skin change or tenderness.  Abdominal:     General: Abdomen is flat.  Genitourinary:    General: Normal vulva.     Pubic Area: No rash or pubic lice.      Labia:        Right: No rash, tenderness, lesion or injury.        Left: No rash, tenderness, lesion or injury.      Urethra: No prolapse, urethral pain or urethral lesion.     Vagina: Vaginal discharge (white thin vaginal discharge at cervix) present. No tenderness.     Cervix: No discharge, erythema or cervical bleeding.     Adnexa:        Right: No mass, tenderness or fullness.         Left: No mass, tenderness or fullness.       Rectum: No external hemorrhoid.  Lymphadenopathy:     Upper Body:     Right upper body: No supraclavicular, axillary or pectoral adenopathy.     Left upper body: No supraclavicular, axillary or pectoral adenopathy.  Skin:    General: Skin is warm.  Neurological:     General: No focal deficit present.     Mental Status: She is alert and oriented to person, place, and time. Mental status is at baseline.  Psychiatric:        Mood and Affect: Mood normal.        Behavior: Behavior normal.        Thought Content: Thought content normal.        Judgment: Judgment normal.      BP 134/78   Pulse 69   Temp 98.2 F (36.8 C)   Resp 16   Ht '5\' 8"'$  (1.727 m)   Wt 214 lb 4 oz (97.2 kg)   SpO2 95%   BMI 32.58 kg/m  Wt Readings from Last 3 Encounters:   01/29/22 214 lb 4 oz (97.2 kg)  01/28/22 220 lb (99.8 kg)  12/22/21 216 lb 6 oz (98.1 kg)     Health Maintenance Due  Topic Date Due  Medicare Annual Wellness (AWV)  Never done   PAP SMEAR-Modifier  12/23/2020   DEXA SCAN  Never done   COVID-19 Vaccine (2 - Pfizer risk series) 12/22/2021    There are no preventive care reminders to display for this patient.  Lab Results  Component Value Date   TSH 1.88 12/22/2021   Lab Results  Component Value Date   WBC 6.7 12/22/2021   HGB 13.7 12/22/2021   HCT 40.2 12/22/2021   MCV 85.0 12/22/2021   PLT 218.0 12/22/2021   Lab Results  Component Value Date   NA 139 12/22/2021   K 4.1 12/22/2021   CO2 29 12/22/2021   GLUCOSE 90 12/22/2021   BUN 12 12/22/2021   CREATININE 0.70 12/22/2021   BILITOT 0.5 12/22/2021   ALKPHOS 75 12/22/2021   AST 18 12/22/2021   ALT 16 12/22/2021   PROT 6.6 12/22/2021   ALBUMIN 4.2 12/22/2021   CALCIUM 9.2 12/22/2021   GFR 90.95 12/22/2021   Lab Results  Component Value Date   CHOL 188 12/22/2021   Lab Results  Component Value Date   HDL 67.60 12/22/2021   Lab Results  Component Value Date   LDLCALC 103 (H) 12/22/2021   Lab Results  Component Value Date   TRIG 90.0 12/22/2021   Lab Results  Component Value Date   CHOLHDL 3 12/22/2021   No results found for: "HGBA1C"    Assessment & Plan:   Problem List Items Addressed This Visit       Genitourinary   Uterine leiomyoma - Primary    Growth since last pelvic u/s in 2019 Referral placed for gyn for eval/treat      Relevant Orders   Ambulatory referral to Obstetrics / Gynecology     Other   Situational anxiety    Refill propanolol 10 mg for as needed for anxiety.      Relevant Medications   propranolol (INDERAL) 10 MG tablet   History of colon polyps   Screening for cervical cancer    Pt verbalized consent for pelvic exam Declines std testing hpv thin prep ordered and pending results Pap exam in office completed,  pt tolerated well.        Relevant Orders   Cytology - PAP   Other Visit Diagnoses     Encounter for gynecological examination with Papanicolaou smear of cervix           Meds ordered this encounter  Medications   propranolol (INDERAL) 10 MG tablet    Sig: Take 1 tablet (10 mg total) by mouth daily as needed (anxiety).    Dispense:  30 tablet    Refill:  1    Follow-up: No follow-ups on file.    Eugenia Pancoast, FNP

## 2022-01-29 NOTE — Assessment & Plan Note (Signed)
Pt verbalized consent for pelvic exam Declines std testing hpv thin prep ordered and pending results Pap exam in office completed, pt tolerated well.

## 2022-02-01 LAB — CYTOLOGY - PAP
Adequacy: ABSENT
Comment: NEGATIVE
Diagnosis: NEGATIVE
High risk HPV: NEGATIVE

## 2022-02-23 ENCOUNTER — Other Ambulatory Visit: Payer: Self-pay | Admitting: Family

## 2022-02-23 DIAGNOSIS — F418 Other specified anxiety disorders: Secondary | ICD-10-CM

## 2022-02-25 ENCOUNTER — Ambulatory Visit
Admission: RE | Admit: 2022-02-25 | Discharge: 2022-02-25 | Disposition: A | Discharge status: Home / Self Care | Payer: Medicare Other | Source: Ambulatory Visit | Attending: Family | Admitting: Family

## 2022-02-25 DIAGNOSIS — Z780 Asymptomatic menopausal state: Secondary | ICD-10-CM | POA: Insufficient documentation

## 2022-03-29 ENCOUNTER — Other Ambulatory Visit: Payer: Self-pay | Admitting: Family

## 2022-03-29 DIAGNOSIS — F418 Other specified anxiety disorders: Secondary | ICD-10-CM

## 2022-04-23 ENCOUNTER — Other Ambulatory Visit: Payer: Self-pay | Admitting: Family

## 2022-04-23 DIAGNOSIS — F418 Other specified anxiety disorders: Secondary | ICD-10-CM

## 2022-04-23 NOTE — Telephone Encounter (Signed)
You just sent in 30 with 1 refill

## 2022-09-27 DIAGNOSIS — H5213 Myopia, bilateral: Secondary | ICD-10-CM | POA: Diagnosis not present

## 2022-09-27 DIAGNOSIS — H43811 Vitreous degeneration, right eye: Secondary | ICD-10-CM | POA: Diagnosis not present

## 2022-09-28 ENCOUNTER — Ambulatory Visit: Payer: Medicare Other | Admitting: Dermatology

## 2022-09-28 ENCOUNTER — Other Ambulatory Visit: Payer: Self-pay | Admitting: Dermatology

## 2022-09-28 VITALS — BP 147/81 | HR 60

## 2022-09-28 DIAGNOSIS — D2362 Other benign neoplasm of skin of left upper limb, including shoulder: Secondary | ICD-10-CM | POA: Diagnosis not present

## 2022-09-28 DIAGNOSIS — D2239 Melanocytic nevi of other parts of face: Secondary | ICD-10-CM

## 2022-09-28 DIAGNOSIS — L853 Xerosis cutis: Secondary | ICD-10-CM | POA: Diagnosis not present

## 2022-09-28 DIAGNOSIS — D229 Melanocytic nevi, unspecified: Secondary | ICD-10-CM

## 2022-09-28 DIAGNOSIS — L814 Other melanin hyperpigmentation: Secondary | ICD-10-CM

## 2022-09-28 DIAGNOSIS — L669 Cicatricial alopecia, unspecified: Secondary | ICD-10-CM

## 2022-09-28 DIAGNOSIS — L821 Other seborrheic keratosis: Secondary | ICD-10-CM

## 2022-09-28 DIAGNOSIS — D2361 Other benign neoplasm of skin of right upper limb, including shoulder: Secondary | ICD-10-CM

## 2022-09-28 DIAGNOSIS — Z85828 Personal history of other malignant neoplasm of skin: Secondary | ICD-10-CM

## 2022-09-28 DIAGNOSIS — L409 Psoriasis, unspecified: Secondary | ICD-10-CM

## 2022-09-28 DIAGNOSIS — L738 Other specified follicular disorders: Secondary | ICD-10-CM

## 2022-09-28 DIAGNOSIS — L578 Other skin changes due to chronic exposure to nonionizing radiation: Secondary | ICD-10-CM | POA: Diagnosis not present

## 2022-09-28 DIAGNOSIS — L661 Lichen planopilaris: Secondary | ICD-10-CM

## 2022-09-28 DIAGNOSIS — Z1283 Encounter for screening for malignant neoplasm of skin: Secondary | ICD-10-CM

## 2022-09-28 DIAGNOSIS — D239 Other benign neoplasm of skin, unspecified: Secondary | ICD-10-CM

## 2022-09-28 DIAGNOSIS — W908XXA Exposure to other nonionizing radiation, initial encounter: Secondary | ICD-10-CM

## 2022-09-28 DIAGNOSIS — D171 Benign lipomatous neoplasm of skin and subcutaneous tissue of trunk: Secondary | ICD-10-CM

## 2022-09-28 DIAGNOSIS — D489 Neoplasm of uncertain behavior, unspecified: Secondary | ICD-10-CM

## 2022-09-28 MED ORDER — MINOXIDIL 2.5 MG PO TABS: 2.5000 mg | ORAL_TABLET | Freq: Every day | ORAL | 6 refills | Status: DC

## 2022-09-28 MED ORDER — CLOBETASOL PROPIONATE 0.05 % EX SOLN: CUTANEOUS | 1 refills | Status: DC

## 2022-09-28 MED ORDER — TACROLIMUS 0.1 % EX OINT: TOPICAL_OINTMENT | CUTANEOUS | 4 refills | Status: DC

## 2022-09-28 NOTE — Progress Notes (Signed)
New Patient Visit   Subjective  Amanda Owens is a 67 y.o. female who presents for the following: Skin Cancer Screening and Full Body Skin Exam Mole on right side that she noticed that was new.  Patient reports history of skin cancer at forehead 5 years ago.  Patient denies any family history of skin cancer.   The patient presents for Total-Body Skin Exam (TBSE) for skin cancer screening and mole check. The patient has spots, moles and lesions to be evaluated, some may be new or changing and the patient may have concern these could be cancer.   The following portions of the chart were reviewed this encounter and updated as appropriate: medications, allergies, medical history  Review of Systems:  No other skin or systemic complaints except as noted in HPI or Assessment and Plan.  Objective  Well appearing patient in no apparent distress; mood and affect are within normal limits.  A full examination was performed including scalp, head, eyes, ears, nose, lips, neck, chest, axillae, abdomen, back, buttocks, bilateral upper extremities, bilateral lower extremities, hands, feet, fingers, toes, fingernails, and toenails. All findings within normal limits unless otherwise noted below.   Relevant physical exam findings are noted in the Assessment and Plan.                     right flank at bra line 1 cm fleshy nodule          Assessment & Plan   SKIN CANCER SCREENING PERFORMED TODAY.  ACTINIC DAMAGE - Chronic condition, secondary to cumulative UV/sun exposure - diffuse scaly erythematous macules with underlying dyspigmentation - Recommend daily broad spectrum sunscreen SPF 30+ to sun-exposed areas, reapply every 2 hours as needed.  - Staying in the shade or wearing long sleeves, sun glasses (UVA+UVB protection) and wide brim hats (4-inch brim around the entire circumference of the hat) are also recommended for sun protection.  - Call for new or changing  lesions.  LENTIGINES, SEBORRHEIC KERATOSES, HEMANGIOMAS - Benign normal skin lesions - Benign-appearing - Call for any changes - SK - at left frontal hairline   MELANOCYTIC NEVI - Tan-brown and/or pink-flesh-colored symmetric macules and papules - Benign appearing on exam today - Observation - Call clinic for new or changing moles - Recommend daily use of broad spectrum spf 30+ sunscreen to sun-exposed areas.  Nevus -  4 mm pink tan papule on right medial cheek   Benign-appearing.  Observation.  Call clinic for new or changing lesions.  Recommend daily use of broad spectrum spf 30+ sunscreen to sun-exposed areas.    DERMATOFIBROMA Right upper arm, left shoulder, right posterior shoulder Exam: Firm pink/brown papulenodule with dimple sign. Treatment Plan: A dermatofibroma is a benign growth possibly related to trauma, such as an insect bite, cut from shaving, or inflamed acne-type bump.  Treatment options to remove include shave or excision with resulting scar and risk of recurrence.  Since benign-appearing and not bothersome, will observe for now.   Xerosis Body  - diffuse xerotic patches - recommend gentle, hydrating skin care - gentle skin care handout given  PSORIASIS Exam: mild scaly psoriasis white patch at right elbow <1 % BSA   Chronic condition with duration or expected duration over one year. Currently well-controlled.   Psoriasis is a chronic non-curable, but treatable genetic/hereditary disease that may have other systemic features affecting other organ systems such as joints (Psoriatic Arthritis). It is associated with an increased risk of inflammatory bowel disease, heart disease,  non-alcoholic fatty liver disease, and depression.  Treatments include light and laser treatments; topical medications; and systemic medications including oral and injectables.  Treatment Plan: Mild involvement Do not recommend treatment at this time.    Frontal fibrosing alopecia   Exam: Scarring alopecia at temporal and frontal scalp with receeding frontal hairline and lonely hairs, mild thinning at crown. see photos  Chronic and persistent condition with duration or expected duration over one year. Condition is bothersome/symptomatic for patient. Currently flared.  Frontal fibrosing alopecia (FFA) is a chronic/progressive and irreversible patterned form of scarring alopecia that presents as a symmetric band-like zone of hair loss along the anterior hairline. The eyebrows are often thinned or absent. It is considered a variant of lichen planopilaris in a patterned distribution and is more common in women. Therapies may stop or slow progression but generally do not lead to hair regrowth.   Treatment Plan:  Start clobetasol solution - apply topically to hairline 1 to 2 times daily 5 days weekly.  Avoid applying to face, groin, and axilla. Use as directed.  Topical steroids (such as triamcinolone, fluocinolone, fluocinonide, mometasone, clobetasol, halobetasol, betamethasone, hydrocortisone) can cause thinning and lightening of the skin if they are used for too long in the same area. Your physician has selected the right strength medicine for your problem and area affected on the body. Please use your medication only as directed by your physician to prevent side effects.   Start tacrolimus ointment - apply topically to forehead at hairline nightly   Start minoxidil 2.5 mg tab - take 1/2 by mouth daily for 1 month if not noticing any side effects can increase to 1 whole tablet by mouth daily.   Discussed starting finasteride 5 mg - 1/2 po qd - patient would like to check with primary doctor before starting due to family history of ovarian cancer.   Doses of minoxidil for hair loss are considered 'low dose'. This is because the doses used for hair loss are much lower than the doses which are used for conditions such as high blood pressure (hypertension). The doses used for  hypertension are 10-40mg  per day.  Side effects are uncommon at the low doses (up to 2.5 mg/day) used to treat hair loss. Potential side effects, more commonly seen at higher doses, include: Increase in hair growth (hypertrichosis) elsewhere on face and body Temporary hair shedding upon starting medication which may last up to 4 weeks Ankle swelling, fluid retention, rapid weight gain more than 5 pounds Low blood pressure and feeling lightheaded or dizzy when standing up quickly Fast or irregular heartbeat Headaches  HISTORY OF SKIN CANCER At forehead 5 years ago - Clear. Observe for recurrence.  - Call clinic for new or changing lesions.   - Recommend regular skin exams, daily broad-spectrum spf 30+ sunscreen use, and photoprotection.     Sebaceous Hyperplasia forehead - Small yellow papules with a central dell - Benign-appearing - Observe. Call for changes.  Frontal fibrosing alopecia  Related Medications minoxidil (LONITEN) 2.5 MG tablet Take 1 tablet (2.5 mg total) by mouth daily.  clobetasol (TEMOVATE) 0.05 % external solution Apply topically 1 to 2 times daily 5 x weekly to hairline of scalp for alopecia  tacrolimus (PROTOPIC) 0.1 % ointment Apply topically to forehead at hairline nightly  Neoplasm of uncertain behavior right flank at bra line  Epidermal / dermal shaving  Lesion diameter (cm):  1 Informed consent: discussed and consent obtained   Patient was prepped and draped in usual  sterile fashion: Area prepped with alcohol. Anesthesia: the lesion was anesthetized in a standard fashion   Anesthetic:  1% lidocaine w/ epinephrine 1-100,000 buffered w/ 8.4% NaHCO3 Instrument used: flexible razor blade   Hemostasis achieved with: pressure, aluminum chloride and electrodesiccation   Outcome: patient tolerated procedure well   Post-procedure details: wound care instructions given   Post-procedure details comment:  Ointment and small bandage applied.   Specimen 1 -  Surgical pathology Differential Diagnosis: r/o Irritated nevus vs skin tag  Check Margins: No  Irritated nevus vs skin tag    Return for 4 - 6 month alopecia follow up , 1 year tbse .  I, Asher Muir, CMA, am acting as scribe for Willeen Niece, MD.   Documentation: I have reviewed the above documentation for accuracy and completeness, and I agree with the above.  Willeen Niece, MD

## 2022-09-28 NOTE — Patient Instructions (Addendum)
For Hair Loss  Start clobetasol solution - apply topically to hairline 1 to 2 times daily 5 days weekly.  Avoid applying to face, groin, and axilla. Use as directed.  Topical steroids (such as triamcinolone, fluocinolone, fluocinonide, mometasone, clobetasol, halobetasol, betamethasone, hydrocortisone) can cause thinning and lightening of the skin if they are used for too long in the same area. Your physician has selected the right strength medicine for your problem and area affected on the body. Please use your medication only as directed by your physician to prevent side effects.   Start tacrolimus ointment - apply topically to forehead at hairline nightly        Start minoxidil 2.5 mg tab - take 1/2 by mouth daily for 1 month if not noticing        any side effects can increase to 1 whole tablet by mouth daily.    Doses of minoxidil for hair loss are considered 'low dose'. This is because the doses used for hair loss are much lower than the doses which are used for conditions such as high blood pressure (hypertension). The doses used for hypertension are 10-40mg  per day.  Side effects are uncommon at the low doses (up to 2.5 mg/day) used to treat hair loss. Potential side effects, more commonly seen at higher doses, include: Increase in hair growth (hypertrichosis) elsewhere on face and body Temporary hair shedding upon starting medication which may last up to 4 weeks Ankle swelling, fluid retention, rapid weight gain more than 5 pounds Low blood pressure and feeling lightheaded or dizzy when standing up quickly Fast or irregular heartbeat Headaches  Check with primary to see if ok starting finasteride for hair loss    Biopsy Wound Care Instructions  Leave the original bandage on for 24 hours if possible.  If the bandage becomes soaked or soiled before that time, it is OK to remove it and examine the wound.  A small amount of post-operative bleeding is normal.  If excessive bleeding  occurs, remove the bandage, place gauze over the site and apply continuous pressure (no peeking) over the area for 30 minutes. If this does not work, please call our clinic as soon as possible or page your doctor if it is after hours.   Once a day, cleanse the wound with soap and water. It is fine to shower. If a thick crust develops you may use a Q-tip dipped into dilute hydrogen peroxide (mix 1:1 with water) to dissolve it.  Hydrogen peroxide can slow the healing process, so use it only as needed.    After washing, apply petroleum jelly (Vaseline) or an antibiotic ointment if your doctor prescribed one for you, followed by a bandage.    For best healing, the wound should be covered with a layer of ointment at all times. If you are not able to keep the area covered with a bandage to hold the ointment in place, this may mean re-applying the ointment several times a day.  Continue this wound care until the wound has healed and is no longer open.   Itching and mild discomfort is normal during the healing process. However, if you develop pain or severe itching, please call our office.   If you have any discomfort, you can take Tylenol (acetaminophen) or ibuprofen as directed on the bottle. (Please do not take these if you have an allergy to them or cannot take them for another reason).  Some redness, tenderness and white or yellow material in the wound  is normal healing.  If the area becomes very sore and red, or develops a thick yellow-green material (pus), it may be infected; please notify us.    If you have stitches, return to clinic as directed to have the stitches removed. You will continue wound care for 2-3 days after the stitches are removed.   Wound healing continues for up to one year following surgery. It is not unusual to experience pain in the scar from time to time during the interval.  If the pain becomes severe or the scar thickens, you should notify the office.    A slight amount of  redness in a scar is expected for the first six months.  After six months, the redness will fade and the scar will soften and fade.  The color difference becomes less noticeable with time.  If there are any problems, return for a post-op surgery check at your earliest convenience.  To improve the appearance of the scar, you can use silicone scar gel, cream, or sheets (such as Mederma or Serica) every night for up to one year. These are available over the counter (without a prescription).  Please call our office at [phone] for any questions or concerns.   Gentle Skin Care Guide  1. Bathe no more than once a day.  2. Avoid bathing in hot water  3. Use a mild soap like Dove, Vanicream, Cetaphil, CeraVe. Can use Lever 2000 or Cetaphil antibacterial soap  4. Use soap only where you need it. On most days, use it under your arms, between your legs, and on your feet. Let the water rinse other areas unless visibly dirty.  5. When you get out of the bath/shower, use a towel to gently blot your skin dry, don't rub it.  6. While your skin is still a little damp, apply a moisturizing cream such as Vanicream, CeraVe, Cetaphil, Eucerin, Sarna lotion or plain Vaseline Jelly. For hands apply Neutrogena [place] Hand Cream or Excipial Hand Cream.  7. Reapply moisturizer any time you start to itch or feel dry.  8. Sometimes using free and clear laundry detergents can be helpful. Fabric softener sheets should be avoided. Downy Free & Gentle liquid, or any liquid fabric softener that is free of dyes and perfumes, it acceptable to use  9. If your doctor has given you prescription creams you may apply moisturizers over them      Seborrheic Keratosis  What causes seborrheic keratoses? Seborrheic keratoses are harmless, common skin growths that first appear during adult life.  As time goes by, more growths appear.  Some people may develop a large number of them.  Seborrheic keratoses appear on both  covered and uncovered body parts.  They are not caused by sunlight.  The tendency to develop seborrheic keratoses can be inherited.  They vary in color from skin-colored to gray, brown, or even black.  They can be either smooth or have a rough, warty surface.   Seborrheic keratoses are superficial and look as if they were stuck on the skin.  Under the microscope this type of keratosis looks like layers upon layers of skin.  That is why at times the top layer may seem to fall off, but the rest of the growth remains and re-grows.    Treatment Seborrheic keratoses do not need to be treated, but can easily be removed in the office.  Seborrheic keratoses often cause symptoms when they rub on clothing or jewelry.  Lesions can be in the way  of shaving.  If they become inflamed, they can cause itching, soreness, or burning.  Removal of a seborrheic keratosis can be accomplished by freezing, burning, or surgery. If any spot bleeds, scabs, or grows rapidly, please return to have it checked, as these can be an indication of a skin cancer.     Melanoma ABCDEs  Melanoma is the most dangerous type of skin cancer, and is the leading cause of death from skin disease.  You are more likely to develop melanoma if you: Have light-colored skin, light-colored eyes, or red or blond hair Spend a lot of time in the sun Tan regularly, either outdoors or in a tanning bed Have had blistering sunburns, especially during childhood Have a close family member who has had a melanoma Have atypical moles or large birthmarks  Early detection of melanoma is key since treatment is typically straightforward and cure rates are extremely high if we catch it early.   The first sign of melanoma is often a change in a mole or a new dark spot.  The ABCDE system is a way of remembering the signs of melanoma.  A for asymmetry:  The two halves do not match. B for border:  The edges of the growth are irregular. C for color:  A mixture of  colors are present instead of an even brown color. D for diameter:  Melanomas are usually (but not always) greater than 6mm - the size of a pencil eraser. E for evolution:  The spot keeps changing in size, shape, and color.  Please check your skin once per month between visits. You can use a small mirror in front and a large mirror behind you to keep an eye on the back side or your body.   If you see any new or changing lesions before your next follow-up, please call to schedule a visit.  Please continue daily skin protection including broad spectrum sunscreen SPF 30+ to sun-exposed areas, reapplying every 2 hours as needed when you're outdoors.   Staying in the shade or wearing long sleeves, sun glasses (UVA+UVB protection) and wide brim hats (4-inch brim around the entire circumference of the hat) are also recommended for sun protection.    Due to recent changes in healthcare laws, you may see results of your pathology and/or laboratory studies on MyChart before the doctors have had a chance to review them. We understand that in some cases there may be results that are confusing or concerning to you. Please understand that not all results are received at the same time and often the doctors may need to interpret multiple results in order to provide you with the best plan of care or course of treatment. Therefore, we ask that you please give [place] 2 business days to thoroughly review all your results before contacting the office for clarification. Should we see a critical lab result, you will be contacted sooner.   If You Need Anything After Your Visit  If you have any questions or concerns for your doctor, please call our main line at [phone] and press option 4 to reach your doctor's medical assistant. If no one answers, please leave a voicemail as directed and we will return your call as soon as possible. Messages left after 4 pm will be answered the following business day.   You may also send  [place] a message via MyChart. We typically respond to MyChart messages within 1-2 business days.  For prescription refills, please ask your pharmacy to contact our  office. Our fax number is [phone].  If you have an urgent issue when the clinic is closed that cannot wait until the next business day, you can page your doctor at the number below.    Please note that while we do our best to be available for urgent issues outside of office hours, we are not available 24/7.   If you have an urgent issue and are unable to reach [place], you may choose to seek medical care at your doctor's office, retail clinic, urgent care center, or emergency room.  If you have a medical emergency, please immediately call 911 or go to the emergency department.  Pager Numbers  - Dr. Gwen Pounds: [phone]  - Dr. Neale Burly: [phone]  - Dr. Roseanne Reno: [phone]  In the event of inclement weather, please call our main line at [phone] for an update on the status of any delays or closures.  Dermatology Medication Tips: Please keep the boxes that topical medications come in in order to help keep track of the instructions about where and how to use these. Pharmacies typically print the medication instructions only on the boxes and not directly on the medication tubes.   If your medication is too expensive, please contact our office at [phone] option 4 or send [place] a message through MyChart.   We are unable to tell what your co-pay for medications will be in advance as this is different depending on your insurance coverage. However, we may be able to find a substitute medication at lower cost or fill out paperwork to get insurance to cover a needed medication.   If a prior authorization is required to get your medication covered by your insurance company, please allow [place] 1-2 business days to complete this process.  Drug prices often vary depending on where the prescription is filled and some pharmacies may offer  cheaper prices.  The website www.goodrx.com contains coupons for medications through different pharmacies. The prices here do not account for what the cost may be with help from insurance (it may be cheaper with your insurance), but the website can give you the price if you did not use any insurance.  - You can print the associated coupon and take it with your prescription to the pharmacy.  - You may also stop by our office during regular business hours and pick up a GoodRx coupon card.  - If you need your prescription sent electronically to a different pharmacy, notify our office through [facility] or by phone at [phone] option 4.     Si Usted Necesita Algo Despus de Su Visita  Tambin puede enviarnos un mensaje a travs de Clinical cytogeneticist. Por lo general respondemos a los mensajes de MyChart en el transcurso de 1 a 2 das hbiles.  Para renovar recetas, por favor pida a su farmacia que se ponga en contacto con nuestra oficina. Annie Sable de fax es [place] [phone].  Si tiene un asunto urgente cuando la clnica est cerrada y que no puede esperar hasta el siguiente da hbil, puede llamar/localizar a su doctor(a) al nmero que aparece a continuacin.   Por favor, tenga en cuenta que aunque hacemos todo lo posible para estar disponibles para asuntos urgentes fuera del horario de [place], no estamos disponibles las 24 horas del da, los 7 [address]  [address] de la [place].   Si tiene un problema urgente y no puede comunicarse con nosotros, puede optar por buscar atencin mdica  en el consultorio de su doctor(a), en una clnica privada, en un  centro de atencin urgente o en una sala de emergencias.  Si tiene Engineer, drilling, por favor llame inmediatamente al 911 o vaya a la sala de emergencias.  Nmeros de bper  - Dr. Gwen Pounds: [phone]  - Dra. Moye: [phone]  - Dra. Roseanne Reno: [phone]  En caso de inclemencias del [place], por favor llame a Lacy Duverney principal al  [phone] para una actualizacin sobre el [place] de cualquier retraso o cierre.  Consejos para la medicacin en dermatologa: Por favor, guarde las cajas en las que vienen los medicamentos de uso tpico para ayudarle a seguir las instrucciones sobre dnde y cmo usarlos. Las farmacias generalmente imprimen las instrucciones del medicamento slo en las cajas y no directamente en los tubos del [place].   Si su medicamento es muy caro, por favor, pngase en contacto con Rolm Gala llamando al [phone] y presione la opcin 4 o envenos un mensaje a travs de Clinical cytogeneticist.   No podemos decirle cul ser su copago por los medicamentos por adelantado ya que esto es diferente dependiendo de la cobertura de su seguro. Sin embargo, es posible que podamos encontrar un medicamento sustituto a Audiological scientist un formulario para que el seguro cubra el medicamento que se considera necesario.   Si se requiere una autorizacin previa para que su compaa de seguros [place] su medicamento, por favor permtanos de 1 a 2 das hbiles para completar [address]  Los precios de los medicamentos varan con frecuencia dependiendo del Environmental consultant de dnde se surte la receta y alguna farmacias pueden ofrecer precios ms baratos.  El sitio web www.goodrx.com tiene cupones para medicamentos de Health and safety inspector. Los precios aqu no tienen en cuenta lo que podra costar con la ayuda del seguro (puede ser ms barato con su seguro), pero el sitio web puede darle el precio si no utiliz Tourist information centre manager.  - Puede imprimir el cupn correspondiente y llevarlo con su receta a la farmacia.  - Tambin puede pasar por nuestra oficina durante el horario de atencin regular y Education officer, museum una tarjeta de cupones de GoodRx.  - Si necesita que su receta se enve electrnicamente a una farmacia diferente, informe a nuestra oficina a travs de MyChart de [place] o por telfono llamando al [phone] y presione la opcin 4.

## 2022-10-05 ENCOUNTER — Telehealth: Payer: Self-pay

## 2022-10-05 NOTE — Telephone Encounter (Signed)
Advised pt of bx results.  Pt advised pharmacy told her tacrolimus not available.  I spoke to pharmacy and they advised Tacrolimus needed a pa.  I advised them to please send PA over and we would do it.  Sent patient mychart advising we would be doing PA./sh

## 2022-10-05 NOTE — Telephone Encounter (Signed)
Tacrolimus PA denied. Spoke with patient and advised her of information. She is going to use [employer] [facility] at this time.

## 2022-10-05 NOTE — Telephone Encounter (Signed)
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

## 2022-10-25 ENCOUNTER — Other Ambulatory Visit: Payer: Self-pay | Admitting: Family

## 2022-10-25 DIAGNOSIS — F418 Other specified anxiety disorders: Secondary | ICD-10-CM

## 2022-12-23 ENCOUNTER — Ambulatory Visit: Payer: Medicare Other

## 2022-12-23 ENCOUNTER — Encounter: Payer: Self-pay | Admitting: Family

## 2022-12-23 ENCOUNTER — Ambulatory Visit (INDEPENDENT_AMBULATORY_CARE_PROVIDER_SITE_OTHER): Payer: Medicare Other | Admitting: Family

## 2022-12-23 VITALS — BP 152/95 | HR 64 | Temp 97.5°F | Ht 68.0 in | Wt 214.0 lb

## 2022-12-23 DIAGNOSIS — F418 Other specified anxiety disorders: Secondary | ICD-10-CM | POA: Diagnosis not present

## 2022-12-23 DIAGNOSIS — Z0000 Encounter for general adult medical examination without abnormal findings: Secondary | ICD-10-CM | POA: Diagnosis not present

## 2022-12-23 DIAGNOSIS — R030 Elevated blood-pressure reading, without diagnosis of hypertension: Secondary | ICD-10-CM | POA: Diagnosis not present

## 2022-12-23 DIAGNOSIS — J301 Allergic rhinitis due to pollen: Secondary | ICD-10-CM

## 2022-12-23 DIAGNOSIS — L659 Nonscarring hair loss, unspecified: Secondary | ICD-10-CM

## 2022-12-23 DIAGNOSIS — J3081 Allergic rhinitis due to animal (cat) (dog) hair and dander: Secondary | ICD-10-CM | POA: Diagnosis not present

## 2022-12-23 DIAGNOSIS — Z23 Encounter for immunization: Secondary | ICD-10-CM | POA: Diagnosis not present

## 2022-12-23 DIAGNOSIS — E7800 Pure hypercholesterolemia, unspecified: Secondary | ICD-10-CM | POA: Diagnosis not present

## 2022-12-23 DIAGNOSIS — D500 Iron deficiency anemia secondary to blood loss (chronic): Secondary | ICD-10-CM

## 2022-12-23 DIAGNOSIS — E559 Vitamin D deficiency, unspecified: Secondary | ICD-10-CM | POA: Diagnosis not present

## 2022-12-23 LAB — BASIC METABOLIC PANEL
BUN: 14 mg/dL (ref 6–23)
CO2: 30 meq/L (ref 19–32)
Calcium: 9.3 mg/dL (ref 8.4–10.5)
Chloride: 105 meq/L (ref 96–112)
Creatinine, Ser: 0.67 mg/dL (ref 0.40–1.20)
GFR: 91.27 mL/min (ref >60.00)
Glucose, Bld: 98 mg/dL (ref 70–99)
Potassium: 4.7 meq/L (ref 3.5–5.1)
Sodium: 141 meq/L (ref 135–145)

## 2022-12-23 LAB — LIPID PANEL
Cholesterol: 196 mg/dL (ref 0–200)
HDL: 69.4 mg/dL (ref >39.00)
LDL Cholesterol: 110 mg/dL — ABNORMAL HIGH (ref 0–99)
NonHDL: 126.88
Total CHOL/HDL Ratio: 3
Triglycerides: 86 mg/dL (ref 0.0–149.0)
VLDL: 17.2 mg/dL (ref 0.0–40.0)

## 2022-12-23 LAB — IBC + FERRITIN
Ferritin: 110.2 ng/mL (ref 10.0–291.0)
Iron: 130 ug/dL (ref 42–145)
Saturation Ratios: 44.9 % (ref 20.0–50.0)
TIBC: 289.8 ug/dL (ref 250.0–450.0)
Transferrin: 207 mg/dL — ABNORMAL LOW (ref 212.0–360.0)

## 2022-12-23 NOTE — Assessment & Plan Note (Signed)
Concern for blood pressure elevation, might need medication management  Pt to perform AHBPM with blood pressure log and report back with average.  Pt advised of the following:  Continue medication as prescribed. Monitor blood pressure periodically and/or when you feel symptomatic. Goal is <130/90 on average. Ensure that you have rested for 30 minutes prior to checking your blood pressure. Record your readings and bring them to your next visit if necessary.work on a low sodium diet.

## 2022-12-23 NOTE — Assessment & Plan Note (Signed)
Stable, continue propanolol 10 mg for as needed for anxiety.

## 2022-12-23 NOTE — Assessment & Plan Note (Signed)
Confirming with test for cat dander, labs pending results.

## 2022-12-23 NOTE — Assessment & Plan Note (Signed)
Continue to f/u with derm as scheduled.

## 2022-12-23 NOTE — Assessment & Plan Note (Signed)
Ordered lipid panel, pending results. Work on low cholesterol diet and exercise as tolerated  

## 2022-12-23 NOTE — Assessment & Plan Note (Signed)
Patient Counseling(The following topics were reviewed):  Preventative care handout given to pt  Health maintenance and immunizations reviewed. Please refer to Health maintenance section. Pt advised on safe sex, wearing seatbelts in car, and proper nutrition labwork ordered today for annual Dental health: Discussed importance of regular tooth brushing, flossing, and dental visits. Prevnar 20 and flu vaccine in office today.

## 2022-12-23 NOTE — Progress Notes (Signed)
Subjective:  Patient ID: Amanda Owens, female    DOB: 11-28-1955  Age: 68 y.o. MRN: [id number]  Patient Care Team: Mort Sawyers, FNP as PCP - General (Family Medicine)   CC:  Chief Complaint  Patient presents with   Medical Management of Chronic Issues    HPI Amanda Owens is a 67 y.o. female who presents today for an annual physical exam. She reports consuming a general diet. The patient does not participate in regular exercise at present. She generally feels well. She reports sleeping well. She does have additional problems to discuss today.   Vision within the last one year.  Dental exam up to date.  Prevnar 20, due for this. Had pneumococcal  23 last year.   Mammogram: 01/25/22, wants to wait two years. Last pap: > 38 y/o  Colonoscopy: every five years, 01/28/22 Bone density scan: 02/25/22 normal  Pt is with acute concerns.  Allergies lately having to take allegra twice daily in am and at night time.  She is also taking an otc decongestant still without relief. Curious if she is allergic to her cat.   Advanced Directives Patient does not have advanced directives    DEPRESSION SCREENING    12/23/2022    7:42 AM 01/29/2022   10:44 AM 12/22/2021    2:53 PM  PHQ 2/9 Scores  PHQ - 2 Score 0 0 0  PHQ- 9 Score 0 0      ROS: Negative unless specifically indicated above in HPI.    Current Outpatient Medications:    clobetasol (TEMOVATE) 0.05 % external solution, Apply topically 1 to 2 times daily 5 x weekly to hairline of scalp for alopecia, Disp: 50 mL, Rfl: 1   fexofenadine-pseudoephedrine (ALLEGRA-D 24) 180-240 MG 24 hr tablet, Take 1 tablet by mouth daily., Disp: , Rfl:    minoxidil (LONITEN) 2.5 MG tablet, Take 1 tablet (2.5 mg total) by mouth daily., Disp: 30 tablet, Rfl: 6   propranolol (INDERAL) 10 MG tablet, TAKE 1 TABLET BY MOUTH EVERY DAY AS NEEDED FOR ANXIETY, Disp: 90 tablet, Rfl: 1   tacrolimus (PROTOPIC) 0.1 % ointment, Apply topically to forehead at  hairline nightly, Disp: 60 g, Rfl: 4    Objective:    BP (!) 152/95   Pulse 64   Temp (!) 97.5 F (36.4 C) (Oral)   Ht 5\' 8"  (1.727 m)   Wt 214 lb (97.1 kg)   SpO2 97%   BMI 32.54 kg/m   BP Readings from Last 3 Encounters:  12/23/22 (!) 152/95  09/28/22 (!) 147/81  01/29/22 134/78      Physical Exam Constitutional:      General: She is not in acute distress.    Appearance: Normal appearance. She is obese. She is not ill-appearing.  HENT:     Head: Normocephalic.     Right Ear: Tympanic membrane normal.     Left Ear: Tympanic membrane normal.     Nose: Nose normal.     Mouth/Throat:     Mouth: Mucous membranes are moist.  Eyes:     Extraocular Movements: Extraocular movements intact.     Pupils: Pupils are equal, round, and reactive to light.  Cardiovascular:     Rate and Rhythm: Normal rate and regular rhythm.  Pulmonary:     Effort: Pulmonary effort is normal.     Breath sounds: Normal breath sounds.  Abdominal:     General: Abdomen is flat. Bowel sounds are normal.     Palpations: Abdomen  is soft.     Tenderness: There is no guarding or rebound.  Musculoskeletal:        General: Normal range of motion.     Cervical back: Normal range of motion.  Skin:    General: Skin is warm.     Capillary Refill: Capillary refill takes less than 2 seconds.  Neurological:     General: No focal deficit present.     Mental Status: She is alert.  Psychiatric:        Mood and Affect: Mood normal.        Behavior: Behavior normal.        Thought Content: Thought content normal.        Judgment: Judgment normal.          Assessment & Plan:  Iron deficiency anemia due to chronic blood loss -     CBC; Future -     IBC + Ferritin; Future  Situational anxiety Assessment & Plan: Stable, continue propanolol 10 mg for as needed for anxiety.   Vitamin D deficiency  Seasonal allergic rhinitis due to pollen  Elevated LDL cholesterol level Assessment & Plan: Ordered  lipid panel, pending results. 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   Orders: -     Cat Dander (e1) IgE with Reflex to Component Panel  Encounter for general adult medical examination without abnormal findings Assessment & Plan: Patient Counseling(The following topics were reviewed):  Preventative care handout given to pt  Health maintenance and immunizations reviewed. Please refer to Health maintenance section. Pt advised on safe sex, wearing seatbelts in car, and proper nutrition labwork ordered today for annual Dental health: Discussed importance of regular tooth brushing, flossing, and dental visits. Prevnar 20 and flu vaccine in office today.   Orders: -     Cat Dander (e1) IgE with Reflex to Component Panel -     Lipid panel -     Basic metabolic panel  Encounter for immunization -     Flu Vaccine Trivalent High Dose (Fluad) -     Pneumococcal conjugate vaccine 20-valent  Alopecia Assessment & Plan: Continue to f/u with derm as scheduled.     Elevated blood-pressure reading without diagnosis of hypertension Assessment & Plan: Concern for blood pressure elevation, might need medication management  Pt to perform AHBPM with blood pressure log and report back with average.  Pt advised of the following:  Continue medication as prescribed. Monitor blood pressure periodically and/or when you feel symptomatic. Goal is <130/90 on average. Ensure that you have rested for 30 minutes prior to checking your blood pressure. Record your readings and bring them to your next visit if necessary.work on a low sodium diet.        Follow-up: Return in about 3 weeks (around 01/13/2023) for f/u blood pressure if no improvement .   Mort Sawyers, FNP

## 2022-12-24 ENCOUNTER — Encounter: Payer: Self-pay | Admitting: Family

## 2022-12-24 LAB — INTERPRETATION:

## 2022-12-24 LAB — CAT DANDER (E1) IGE WITH REFLEX TO COMPONENT PANEL
Cat Dander: <0.1 kU/L
Class: 0

## 2022-12-24 NOTE — Telephone Encounter (Signed)
Last OV: 12/23/2022 Pending OV: Nothing scheduled at this time Medication Alprazolam 0.5mg  Directions: Take 1 at bedtime prn for anxiety Last Refill: 12/22/2021 - from Dr. Alden Server: #30 with 0 refills

## 2022-12-27 MED ORDER — ALPRAZOLAM 0.5 MG PO TABS: 0.5000 mg | ORAL_TABLET | Freq: Every evening | ORAL | 0 refills | Status: DC | PRN

## 2022-12-28 ENCOUNTER — Other Ambulatory Visit: Payer: Self-pay | Admitting: Dermatology

## 2022-12-28 DIAGNOSIS — L6612 Frontal fibrosing alopecia: Secondary | ICD-10-CM

## 2023-01-27 ENCOUNTER — Encounter: Payer: Self-pay | Admitting: Family

## 2023-01-27 ENCOUNTER — Ambulatory Visit: Payer: Medicare Other | Admitting: Family

## 2023-01-27 VITALS — BP 182/106 | HR 90 | Temp 98.2°F | Ht 68.0 in | Wt 218.4 lb

## 2023-01-27 DIAGNOSIS — F411 Generalized anxiety disorder: Secondary | ICD-10-CM | POA: Insufficient documentation

## 2023-01-27 DIAGNOSIS — F418 Other specified anxiety disorders: Secondary | ICD-10-CM | POA: Diagnosis not present

## 2023-01-27 DIAGNOSIS — I10 Essential (primary) hypertension: Secondary | ICD-10-CM

## 2023-01-27 MED ORDER — SERTRALINE HCL 50 MG PO TABS
50.0000 mg | ORAL_TABLET | Freq: Every day | ORAL | 0 refills | Status: DC
Start: 2023-01-27 — End: 2023-04-25

## 2023-01-27 MED ORDER — OLMESARTAN MEDOXOMIL 20 MG PO TABS
20.0000 mg | ORAL_TABLET | Freq: Every day | ORAL | 0 refills | Status: DC
Start: 2023-01-27 — End: 2023-01-31

## 2023-01-27 NOTE — Patient Instructions (Signed)
 ------------------------------------    Start zoloft 50 mg for anxiety and depression. Take 1/2 tablet by mouth once daily for about one week, then increase to 1 full tablet thereafter.   Taking the medicine as directed and not missing any doses is one of the best things you can do to treat your anxiety. Here are some things to keep in mind:  Side effects (stomach upset, some increased anxiety) may happen before you notice a benefit.  These side effects typically go away over time. Changes to your dose of medicine or a change in medication all together is sometimes necessary Many people will notice an improvement within two weeks but the full effect of the medication can take up to 4-6 weeks Stopping the medication when you start feeling better often results in a return of symptoms. Most people need to be on medication at least 6-12 months If you start having thoughts of hurting yourself or others after starting this medicine, please call me immediately.    ------------------------------------

## 2023-01-27 NOTE — Progress Notes (Signed)
Established Patient Office Visit  Subjective:   Patient ID: Amanda Owens, female    DOB: May 20, 1955  Age: 67 y.o. MRN: [id number]  CC:  Chief Complaint  Patient presents with   Medical Management of Chronic Issues    Blood pressure check    HPI: Amanda Owens is a 67 y.o. female presenting on 01/27/2023 for Medical Management of Chronic Issues (Blood pressure check)  HTN: elevated, at home blood pressure average 155/80. Highest 166/89 lowest 148/82. She is resting prior to taking these readings. She denies sx to include headache, sob, chest pain. She is taking propanolol prn. She is very anxious to take blood pressure medication because two weeks ago her sister starting a new blood pressure medication, diovan and it dropped her blood pressure so low that she had to go to the ER but she is tolerating olmesartan well.   Anxiety state, not improving, not helping her blood pressure either, and she is open to starting it. Her sister takes zoloft and she has taken this in the past without side effect and states it helped her anxiety.      ROS: Negative unless specifically indicated above in HPI.   Relevant past medical history reviewed and updated as indicated.   Allergies and medications reviewed and updated.   Current Outpatient Medications:    ALPRAZolam (XANAX) 0.5 MG tablet, Take 1 tablet (0.5 mg total) by mouth at bedtime as needed for anxiety., Disp: 30 tablet, Rfl: 0   clobetasol (TEMOVATE) 0.05 % external solution, Apply topically 1 to 2 times daily 5 x weekly to hairline of scalp for alopecia, Disp: 50 mL, Rfl: 1   fexofenadine-pseudoephedrine (ALLEGRA-D 24) 180-240 MG 24 hr tablet, Take 1 tablet by mouth daily., Disp: , Rfl:    minoxidil (LONITEN) 2.5 MG tablet, TAKE 1 TABLET BY MOUTH EVERY DAY, Disp: 90 tablet, Rfl: 2   olmesartan (BENICAR) 20 MG tablet, Take 1 tablet (20 mg total) by mouth daily., Disp: 90 tablet, Rfl: 0   propranolol (INDERAL) 10 MG tablet, TAKE 1 TABLET  BY MOUTH EVERY DAY AS NEEDED FOR ANXIETY, Disp: 90 tablet, Rfl: 1   sertraline (ZOLOFT) 50 MG tablet, Take 1 tablet (50 mg total) by mouth daily., Disp: 90 tablet, Rfl: 0   tacrolimus (PROTOPIC) 0.1 % ointment, Apply topically to forehead at hairline nightly, Disp: 60 g, Rfl: 4  Allergies  Allergen Reactions   Sulfa Antibiotics Hives and Swelling    Objective:   BP (!) 182/106 (BP Location: Left Arm, Patient Position: Sitting, Cuff Size: Normal)   Pulse 90   Temp 98.2 F (36.8 C) (Temporal)   Ht 5\' 8"  (1.727 m)   Wt 218 lb 6.4 oz (99.1 kg)   SpO2 96%   BMI 33.21 kg/m    Physical Exam Constitutional:      General: She is not in acute distress.    Appearance: Normal appearance. She is normal weight. She is not ill-appearing, toxic-appearing or diaphoretic.  HENT:     Head: Normocephalic.  Cardiovascular:     Rate and Rhythm: Normal rate and regular rhythm.  Pulmonary:     Effort: Pulmonary effort is normal.  Musculoskeletal:        General: Normal range of motion.  Neurological:     General: No focal deficit present.     Mental Status: She is alert and oriented to person, place, and time. Mental status is at baseline.  Psychiatric:        Mood and Affect:  Mood normal.        Behavior: Behavior normal.        Thought Content: Thought content normal.        Judgment: Judgment normal.     Assessment & Plan:  Primary hypertension Assessment & Plan: Start olmesartan 20 mg once daily  Pt advised of the following:  Continue medication as prescribed. Monitor blood pressure periodically and/or when you feel symptomatic. Goal is <130/90 on average. Ensure that you have rested for 30 minutes prior to checking your blood pressure. Record your readings and bring them to your next visit if necessary.work on a low sodium diet.    Orders: -     Olmesartan Medoxomil; Take 1 tablet (20 mg total) by mouth daily.  Dispense: 90 tablet; Refill: 0 -     Microalbumin / creatinine urine  ratio  Situational anxiety  GAD (generalized anxiety disorder) Assessment & Plan:  I instructed pt to start sertraline 1/2 tablet once daily for 1 week and then increase to a full tablet once daily on week two as tolerated.  We discussed common side effects such as nausea, drowsiness and weight gain.  Also discussed rare but serious side effect of suicidal ideation.  She is instructed to discontinue medication and go directly to ED if this occurs.  Pt verbalizes understanding.  Plan is to follow up in 30 days to evaluate progress.   I did advise her to wait one week after starting olmesartan to monitor for hypersensitivity.    Orders: -     Sertraline HCl; Take 1 tablet (50 mg total) by mouth daily.  Dispense: 90 tablet; Refill: 0     Follow up plan: Return in about 6 weeks (around 03/10/2023) for f/u anxiety.  Mort Sawyers, FNP

## 2023-01-27 NOTE — Assessment & Plan Note (Signed)
Start olmesartan 20 mg once daily  Pt advised of the following:  Continue medication as prescribed. Monitor blood pressure periodically and/or when you feel symptomatic. Goal is <130/90 on average. Ensure that you have rested for 30 minutes prior to checking your blood pressure. Record your readings and bring them to your next visit if necessary.work on a low sodium diet.

## 2023-01-27 NOTE — Assessment & Plan Note (Signed)
I instructed pt to start sertraline 1/2 tablet once daily for 1 week and then increase to a full tablet once daily on week two as tolerated.  We discussed common side effects such as nausea, drowsiness and weight gain.  Also discussed rare but serious side effect of suicidal ideation.  She is instructed to discontinue medication and go directly to ED if this occurs.  Pt verbalizes understanding.  Plan is to follow up in 30 days to evaluate progress.   I did advise her to wait one week after starting olmesartan to monitor for hypersensitivity.

## 2023-01-27 NOTE — Addendum Note (Signed)
Addended by: Alvina Chou on: 01/27/2023 09:41 AM   Modules accepted: Orders

## 2023-01-31 MED ORDER — OLMESARTAN MEDOXOMIL 40 MG PO TABS
40.0000 mg | ORAL_TABLET | Freq: Every day | ORAL | 0 refills | Status: DC
Start: 2023-01-31 — End: 2023-02-14

## 2023-02-14 ENCOUNTER — Other Ambulatory Visit: Payer: Self-pay | Admitting: Family

## 2023-02-14 DIAGNOSIS — I10 Essential (primary) hypertension: Secondary | ICD-10-CM

## 2023-02-14 MED ORDER — OLMESARTAN MEDOXOMIL 40 MG PO TABS
40.0000 mg | ORAL_TABLET | Freq: Every day | ORAL | 0 refills | Status: DC
Start: 2023-02-14 — End: 2023-12-21

## 2023-03-22 ENCOUNTER — Ambulatory Visit: Payer: Medicare Other | Admitting: Dermatology

## 2023-04-20 ENCOUNTER — Other Ambulatory Visit: Payer: Self-pay | Admitting: Family

## 2023-04-20 DIAGNOSIS — F418 Other specified anxiety disorders: Secondary | ICD-10-CM

## 2023-04-25 ENCOUNTER — Other Ambulatory Visit: Payer: Self-pay | Admitting: Family

## 2023-04-25 DIAGNOSIS — F411 Generalized anxiety disorder: Secondary | ICD-10-CM

## 2023-04-25 DIAGNOSIS — I10 Essential (primary) hypertension: Secondary | ICD-10-CM

## 2023-10-10 ENCOUNTER — Ambulatory Visit: Payer: Medicare Other | Admitting: Dermatology

## 2023-10-12 ENCOUNTER — Other Ambulatory Visit: Payer: Self-pay | Admitting: Family

## 2023-10-12 DIAGNOSIS — F418 Other specified anxiety disorders: Secondary | ICD-10-CM

## 2023-10-12 NOTE — Telephone Encounter (Signed)
 Lvmtcb, sent mychart message

## 2023-10-19 ENCOUNTER — Other Ambulatory Visit: Payer: Self-pay | Admitting: Family

## 2023-10-19 DIAGNOSIS — F411 Generalized anxiety disorder: Secondary | ICD-10-CM

## 2023-10-31 DIAGNOSIS — H43811 Vitreous degeneration, right eye: Secondary | ICD-10-CM | POA: Diagnosis not present

## 2023-12-05 ENCOUNTER — Other Ambulatory Visit: Payer: Self-pay | Admitting: Family

## 2023-12-05 DIAGNOSIS — I10 Essential (primary) hypertension: Secondary | ICD-10-CM

## 2023-12-20 ENCOUNTER — Ambulatory Visit

## 2023-12-20 VITALS — BP 126/82 | Ht 68.0 in | Wt 222.0 lb

## 2023-12-20 DIAGNOSIS — Z0000 Encounter for general adult medical examination without abnormal findings: Secondary | ICD-10-CM | POA: Diagnosis not present

## 2023-12-20 DIAGNOSIS — Z23 Encounter for immunization: Secondary | ICD-10-CM | POA: Diagnosis not present

## 2023-12-20 NOTE — Progress Notes (Signed)
 Subjective:   Amanda Owens is a 68 y.o. who presents for a Medicare Wellness preventive visit.  As a reminder, Annual Wellness Visits don't include a physical exam, and some assessments may be limited, especially if this visit is performed virtually. We may recommend an in-person follow-up visit with your provider if needed.  Visit Complete: In person  Persons Participating in Visit: Patient.  AWV Questionnaire: No: Patient Medicare AWV questionnaire was not completed prior to this visit.  Cardiac Risk Factors include: advanced age (>103men, >38 women);hypertension;obesity (BMI >30kg/m2);sedentary lifestyle     Objective:    Today's Vitals   12/20/23 0809  BP: 126/82  Weight: 222 lb (100.7 kg)  Height: 5' 8 (1.727 m)   Body mass index is 33.75 kg/m.     12/20/2023    8:24 AM 01/28/2022   10:12 AM  Advanced Directives  Does Patient Have a Medical Advance Directive? No No    Current Medications (verified) Outpatient Encounter Medications as of 12/20/2023  Medication Sig   ALPRAZolam  (XANAX ) 0.5 MG tablet Take 1 tablet (0.5 mg total) by mouth at bedtime as needed for anxiety.   fexofenadine-pseudoephedrine (ALLEGRA-D 24) 180-240 MG 24 hr tablet Take 1 tablet by mouth daily.   olmesartan  (BENICAR ) 40 MG tablet TAKE 1 TABLET BY MOUTH EVERY DAY   propranolol  (INDERAL ) 10 MG tablet TAKE 1 TABLET BY MOUTH EVERY DAY AS NEEDED FOR ANXIETY   sertraline  (ZOLOFT ) 50 MG tablet TAKE 1 TABLET BY MOUTH EVERY DAY   clobetasol  (TEMOVATE ) 0.05 % external solution Apply topically 1 to 2 times daily 5 x weekly to hairline of scalp for alopecia   minoxidil  (LONITEN ) 2.5 MG tablet TAKE 1 TABLET BY MOUTH EVERY DAY   olmesartan  (BENICAR ) 40 MG tablet Take 1 tablet (40 mg total) by mouth daily.   tacrolimus  (PROTOPIC ) 0.1 % ointment Apply topically to forehead at hairline nightly   No facility-administered encounter medications on file as of 12/20/2023.    Allergies (verified) Sulfa  antibiotics   History: Past Medical History:  Diagnosis Date   Allergy    Past Surgical History:  Procedure Laterality Date   COLONOSCOPY WITH PROPOFOL      COLONOSCOPY WITH PROPOFOL  N/A 01/28/2022   Procedure: COLONOSCOPY WITH PROPOFOL ;  Surgeon: Therisa Bi, MD;  Location: [facility] ENDOSCOPY;  Service: Gastroenterology;  Laterality: N/A;   UTERINE ARTERY EMBOLIZATION     Family History  Problem Relation Age of Onset   Stroke Mother    Hypertension Mother    Alcohol abuse Father    Colon cancer Father        dx around age 47   Alcohol abuse Sister    Ovarian cancer Sister    Hypertension Sister    Alcohol abuse Sister    Ovarian cancer Sister    Hypertension Sister    Colon polyps Sister        precancerous   Hypertension Sister    Diabetes Sister    Hypertension Brother    Heart disease Brother    Early death Brother    Diabetes Brother    ADD / ADHD Brother    Early death Maternal Grandmother    Stroke Maternal Grandfather    Early death Maternal Grandfather    Colon cancer Paternal Grandmother    Early death Paternal Grandfather    Breast cancer Neg Hx    Social History   Socioeconomic History   Marital status: Single    Spouse name: Not on file  Number of children: 0   Years of education: Not on file   Highest education level: Master's degree (e.g., MA, MS, MEng, MEd, MSW, MBA)  Occupational History   Occupation: retired  Tobacco Use   Smoking status: Never   Smokeless tobacco: Never  Vaping Use   Vaping status: Never Used  Substance and Sexual Activity   Alcohol use: Yes    Alcohol/week: 1.0 standard drink of alcohol    Types: 1 Shots of liquor per week    Comment: one shot bourbon with dinner   Drug use: Never   Sexual activity: Not Currently  Other Topics Concern   Not on file  Social History Narrative   Not on file   Social Drivers of Health   Financial Resource Strain: Low Risk  (12/20/2023)   Overall Financial Resource Strain (CARDIA)     Difficulty of Paying Living Expenses: Not hard at all  Food Insecurity: No Food Insecurity (12/20/2023)   Hunger Vital Sign    Worried About Running Out of Food in the Last Year: Never true    Ran Out of Food in the Last Year: Never true  Transportation Needs: No Transportation Needs (12/20/2023)   PRAPARE - Administrator, Civil Service (Medical): No    Lack of Transportation (Non-Medical): No  Physical Activity: Inactive (12/20/2023)   Exercise Vital Sign    Days of Exercise per Week: 0 days    Minutes of Exercise per Session: 0 min  Stress: No Stress Concern Present (12/20/2023)   [employer] of Occupational Health - Occupational Stress Questionnaire    Feeling of Stress: Not at all  Social Connections: Moderately Isolated (12/20/2023)   Social Connection and Isolation Panel    Frequency of Communication with Friends and Family: More than three times a week    Frequency of Social Gatherings with Friends and Family: More than three times a week    Attends Religious Services: Never    Database administrator or Organizations: No    Attends Engineer, structural: More than 4 times per year    Marital Status: Never married    Tobacco Counseling Counseling given: Not Answered    Clinical Intake:  Pre-visit preparation completed: Yes  Pain : No/denies pain     BMI - recorded: 33.75 Nutritional Status: BMI > 30  Obese Nutritional Risks: None Diabetes: No  No results found for: HGBA1C   How often do you need to have someone help you when you read instructions, pamphlets, or other written materials from your doctor or pharmacy?: 1 - Never  Interpreter Needed?: No  Comments: lives with others Information entered by :: B.Jerell Demery,LPN   Activities of Daily Living     12/20/2023    8:26 AM  In your present state of health, do you have any difficulty performing the following activities:  Hearing? 0  Vision? 0  Difficulty concentrating or making  decisions? 0  Walking or climbing stairs? 0  Dressing or bathing? 0  Doing errands, shopping? 0  Preparing Food and eating ? N  Using the Toilet? N  In the past six months, have you accidently leaked urine? N  Do you have problems with loss of bowel control? N  Managing your Medications? N  Managing your Finances? N  Housekeeping or managing your Housekeeping? N    Patient Care Team: [facility], [place] as PCP - General  I have updated your Care Teams any recent Medical Services you  may have received from other providers in the past year.     Assessment:   This is a routine wellness examination for Amanda Owens.  Hearing/Vision screen Hearing Screening - Comments:: Patient denies any hearing difficulties.   Vision Screening - Comments:: Pt says their vision is good with glasses/contacts [facility]-Dr Milissa   Goals Addressed             This Visit's Progress    Patient Stated       I want to stay mobile, in my right mind and continue enjoying my retirement       Depression Screen     12/20/2023    8:21 AM 01/27/2023    7:39 AM 12/23/2022    7:42 AM 01/29/2022   10:44 AM 12/22/2021    2:53 PM  PHQ 2/9 Scores  PHQ - 2 Score 0 6 0 0 0  PHQ- 9 Score  15 0 0     Fall Risk     12/20/2023    8:13 AM 01/27/2023    7:39 AM 12/23/2022    7:42 AM 12/22/2021    2:53 PM  Fall Risk   Falls in the past year? 0 0 0 0  Number falls in past yr: 0 0 0 0  Injury with Fall? 0 0 0 0  Risk for fall due to : No Fall Risks No Fall Risks No Fall Risks No Fall Risks  Follow up Education provided;Falls prevention discussed Falls evaluation completed Falls evaluation completed     MEDICARE RISK AT HOME:  Medicare Risk at Home Any stairs in or around the home?: Yes If so, are there any without handrails?: Yes Home free of loose throw rugs in walkways, pet beds, electrical cords, etc?: Yes Adequate lighting in your home to reduce risk of falls?: Yes Life alert?: No Use of  a cane, walker or w/c?: No Grab bars in the bathroom?: No Shower chair or bench in shower?: No Elevated toilet seat or a handicapped toilet?: No  TIMED UP AND GO:  Was the test performed?  Yes  Length of time to ambulate 10 feet: 10 sec Gait steady and fast without use of assistive device  Cognitive Function: 6CIT completed        12/20/2023    8:26 AM  6CIT Screen  What Year? 0 points  What month? 0 points  What time? 0 points  Count back from 20 0 points  Months in reverse 0 points  Repeat phrase 0 points  Total Score 0 points    Immunizations Immunization History  Administered Date(s) Administered   Fluad Trivalent(High Dose 65+) 12/23/2022   Influenza Inj Mdck Quad With Preservative 12/08/2021   Influenza, Seasonal, Injecte, Preservative Fre 12/20/2023   Moderna Covid-19 Vaccine Bivalent Booster 57yrs & up 12/16/2022   PFIZER(Purple Top)SARS-COV-2 Vaccination 12/01/2021   PNEUMOCOCCAL CONJUGATE-20 12/23/2022   Pneumococcal Polysaccharide-23 12/22/2021   Tdap 12/25/2014   Zoster Recombinant(Shingrix) 09/30/2016, 01/03/2018, 03/14/2018    Screening Tests Health Maintenance  Topic Date Due   Hepatitis C Screening  Never done   COVID-19 Vaccine (3 - Mixed Product risk series) 01/13/2023   Mammogram  01/26/2024   Medicare Annual Wellness (AWV)  12/19/2024   DTaP/Tdap/Td (2 - Td or Tdap) 12/24/2024   Colonoscopy  01/29/2027   Pneumococcal Vaccine: 50+ Years  Completed   Influenza Vaccine  Completed   DEXA SCAN  Completed   Zoster Vaccines- Shingrix  Completed   Meningococcal B Vaccine  Aged  Out    Health Maintenance Items Addressed: Vaccines Given today: Influenza  Additional Screening:  Vision Screening: Recommended annual ophthalmology exams for early detection of glaucoma and other disorders of the eye. Is the patient up to date with their annual eye exam?  Yes  Who is the provider or what is the name of the office in which the patient attends annual  eye exams? Dr Milissa  Dental Screening: Recommended annual dental exams for proper oral hygiene  Community Resource Referral / Chronic Care Management: CRR required this visit?  No   CCM required this visit?  Appt scheduled with PCP for CPE and OV (med refiills)   Plan:    I have personally reviewed and noted the following in the patient's chart:   Medical and social history Use of alcohol, tobacco or illicit drugs  Current medications and supplements including opioid prescriptions. Patient is not currently taking opioid prescriptions. Functional ability and status Nutritional status Physical activity Advanced directives List of other physicians Hospitalizations, surgeries, and ER visits in previous 12 months Vitals Screenings to include cognitive, depression, and falls Referrals and appointments  In addition, I have reviewed and discussed with patient certain preventive protocols, quality metrics, and best practice recommendations. A written personalized care plan for preventive services as well as general preventive health recommendations were provided to patient.   Erminio LITTIE Saris, LPN   [id number]/04/[id number]   After Visit Summary: Pt in person/declines, will see on mychart  Notes: Pt thinking visit was with PCP and needs refills of medications. Made appt for tomorrow for medication refills

## 2023-12-20 NOTE — Patient Instructions (Signed)
 [name],  Thank you for taking the time for your Medicare Wellness Visit. I appreciate your continued commitment to your health goals. Please review the care plan we discussed, and feel free to reach out if I can assist you further.  Medicare recommends these wellness visits once per year to help you and your care team stay ahead of potential health issues. These visits are designed to focus on prevention, allowing your provider to concentrate on managing your acute and chronic conditions during your regular appointments.  Please note that Annual Wellness Visits do not include a physical exam. Some assessments may be limited, especially if the visit was conducted virtually. If needed, we may recommend a separate in-person follow-up with your provider.  Ongoing Care Seeing your primary care provider every 3 to 6 months helps us  monitor your health and provide consistent, personalized care.   Referrals If a referral was made during today's visit and you haven't received any updates within two weeks, please contact the referred provider directly to check on the status.  Recommended Screenings:  Health Maintenance  Topic Date Due   Hepatitis C Screening  Never done   COVID-19 Vaccine (3 - Mixed Product risk series) 01/13/2023   Flu Shot  10/14/2023   Breast Cancer Screening  01/26/2024   Medicare Annual Wellness Visit  12/19/2024   DTaP/Tdap/Td vaccine (2 - Td or Tdap) 12/24/2024   Colon Cancer Screening  01/29/2027   Pneumococcal Vaccine for age over 31  Completed   DEXA scan (bone density measurement)  Completed   Zoster (Shingles) Vaccine  Completed   Meningitis B Vaccine  Aged Out       01/28/2022   10:12 AM  Advanced Directives  Does Patient Have a Medical Advance Directive? No   Advance Care Planning is important because it: Ensures you receive medical care that aligns with your values, goals, and preferences. Provides guidance to your family and loved ones, reducing the  emotional burden of decision-making during critical moments.  Vision: Annual vision screenings are recommended for early detection of glaucoma, cataracts, and diabetic retinopathy. These exams can also reveal signs of chronic conditions such as diabetes and high blood pressure.  Dental: Annual dental screenings help detect early signs of oral cancer, gum disease, and other conditions linked to overall health, including heart disease and diabetes.  Please see the attached documents for additional preventive care recommendations.

## 2023-12-21 ENCOUNTER — Ambulatory Visit: Admitting: Family

## 2023-12-21 ENCOUNTER — Encounter: Payer: Self-pay | Admitting: Family

## 2023-12-21 VITALS — BP 132/82 | HR 64 | Temp 98.0°F | Ht 68.0 in | Wt 221.0 lb

## 2023-12-21 DIAGNOSIS — F411 Generalized anxiety disorder: Secondary | ICD-10-CM

## 2023-12-21 DIAGNOSIS — F418 Other specified anxiety disorders: Secondary | ICD-10-CM

## 2023-12-21 DIAGNOSIS — J3081 Allergic rhinitis due to animal (cat) (dog) hair and dander: Secondary | ICD-10-CM | POA: Diagnosis not present

## 2023-12-21 DIAGNOSIS — Z79899 Other long term (current) drug therapy: Secondary | ICD-10-CM | POA: Diagnosis not present

## 2023-12-21 DIAGNOSIS — I10 Essential (primary) hypertension: Secondary | ICD-10-CM

## 2023-12-21 MED ORDER — ALPRAZOLAM 0.5 MG PO TABS: 0.5000 mg | ORAL_TABLET | Freq: Every evening | ORAL | 2 refills | Status: AC | PRN

## 2023-12-21 MED ORDER — SERTRALINE HCL 50 MG PO TABS: 50.0000 mg | ORAL_TABLET | Freq: Every day | ORAL | 3 refills | Status: AC

## 2023-12-21 MED ORDER — FEXOFENADINE HCL 180 MG PO TABS: 180.0000 mg | ORAL_TABLET | Freq: Every day | ORAL | Status: AC

## 2023-12-21 MED ORDER — OLMESARTAN MEDOXOMIL 40 MG PO TABS: 40.0000 mg | ORAL_TABLET | Freq: Every day | ORAL | 3 refills | Status: AC

## 2023-12-21 MED ORDER — PROPRANOLOL HCL 10 MG PO TABS: 10.0000 mg | ORAL_TABLET | Freq: Every day | ORAL | 2 refills | Status: DC | PRN

## 2023-12-21 NOTE — Progress Notes (Signed)
 Established Patient Office Visit  Subjective:      CC:  Chief Complaint  Patient presents with   Medical Management of Chronic Issues    Medication refills    HPI: Amanda Owens is a 68 y.o. female presenting on 12/21/2023 for Medical Management of Chronic Issues (Medication refills) .  Discussed the use of AI scribe software for clinical note transcription with the patient, who gave verbal consent to proceed.  History of Present Illness Amanda Owens is a 68 year old female who presents for medication refills and follow-up.  Anxiety is generally well-controlled and is primarily situational, such as when driving on [address]. She uses alprazolam  as needed for these situations and finds it effective without causing sedation. Propranolol  is also used as needed, often in conjunction with alprazolam .  She is currently taking olmesartan  40 mg and reports that her blood pressure was well controlled when she was checking it. She has not been taking Allegra D due to concerns about it raising her blood pressure and instead uses plain Allegra.  She is on sertraline  50 mg once daily and it is working well for her.  No use of controlled substances, marijuana, and she is not a smoker.  History of alopecia with previous dermatology consultation, but medications prescribed were ineffective. She has considered using a wig if her current method of managing the condition becomes ineffective.         Social history:  Relevant past medical, surgical, family and social history reviewed and updated as indicated. Interim medical history since our last visit reviewed.  Allergies and medications reviewed and updated.  DATA REVIEWED: CHART IN EPIC     ROS: Negative unless specifically indicated above in HPI.    Current Outpatient Medications:    fexofenadine (ALLEGRA ALLERGY) 180 MG tablet, Take 1 tablet (180 mg total) by mouth daily., Disp: , Rfl:    ALPRAZolam  (XANAX ) 0.5 MG tablet, Take  1 tablet (0.5 mg total) by mouth at bedtime as needed for anxiety., Disp: 30 tablet, Rfl: 2   olmesartan  (BENICAR ) 40 MG tablet, Take 1 tablet (40 mg total) by mouth daily., Disp: 90 tablet, Rfl: 3   propranolol  (INDERAL ) 10 MG tablet, Take 1 tablet (10 mg total) by mouth daily as needed., Disp: 30 tablet, Rfl: 2   sertraline  (ZOLOFT ) 50 MG tablet, Take 1 tablet (50 mg total) by mouth daily., Disp: 90 tablet, Rfl: 3        Objective:        BP 132/82 (BP Location: Left Arm, Patient Position: Sitting, Cuff Size: Large)   Pulse 64   Temp 98 F (36.7 C) (Temporal)   Ht 5' 8 (1.727 m)   Wt 221 lb (100.2 kg)   BMI 33.60 kg/m   Physical Exam   Wt Readings from Last 3 Encounters:  12/21/23 221 lb (100.2 kg)  12/20/23 222 lb (100.7 kg)  01/27/23 218 lb 6.4 oz (99.1 kg)    Physical Exam Constitutional:      General: She is not in acute distress.    Appearance: Normal appearance. She is normal weight. She is not ill-appearing, toxic-appearing or diaphoretic.  HENT:     Head: Normocephalic.  Cardiovascular:     Rate and Rhythm: Normal rate and regular rhythm.  Pulmonary:     Effort: Pulmonary effort is normal.  Musculoskeletal:        General: Normal range of motion.  Neurological:     General: No focal deficit present.  Mental Status: She is alert and oriented to person, place, and time. Mental status is at baseline.  Psychiatric:        Mood and Affect: Mood normal.        Behavior: Behavior normal.        Thought Content: Thought content normal.        Judgment: Judgment normal.          Results LABS Iron: within normal limits  Assessment & Plan:   Assessment and Plan Assessment & Plan Generalized anxiety disorder Generalized anxiety disorder is well controlled with situational exacerbations, such as stress while driving on the highway. Alprazolam  is used sparingly and effectively for acute anxiety episodes. Propranolol  is used as needed, often in  conjunction with alprazolam , and is effective in managing symptoms without significant sedation. - Refill alprazolam  with refills for as-needed use.pdmp reviewed - Refill propranolol  for as-needed use. - Sign drug contract for alprazolam  use.  Primary hypertension Primary hypertension is well controlled with olmesartan . Blood pressure readings have been stable. She avoids the decongestant component of Allegra D to prevent potential blood pressure elevation. - Refill olmesartan  for continued management of hypertension. - Change Allegra D to plain Allegra in the chart.  General Health Maintenance General health maintenance is up to date with no acute issues. A urine drug screen is required annually due to alprazolam  prescription, and a urine microalbumin test was missed last year. - Perform urine drug screen annually; defer to February if unable to provide a sample today. - Perform urine microalbumin test in February.        Return for f/u as scheduled in feb for CPE .     Ginger Patrick, MSN, APRN, FNP-C [facility]

## 2024-03-24 ENCOUNTER — Other Ambulatory Visit: Payer: Self-pay | Admitting: Family

## 2024-03-24 DIAGNOSIS — F418 Other specified anxiety disorders: Secondary | ICD-10-CM

## 2024-05-01 ENCOUNTER — Encounter: Admitting: Family

## 2024-12-20 ENCOUNTER — Ambulatory Visit

## 2024-12-21 ENCOUNTER — Ambulatory Visit
